# Patient Record
Sex: Male | Born: 1960 | Hispanic: Yes | Marital: Single | Smoking: Former smoker
Health system: Southern US, Community
[De-identification: ages and names within clinical notes are randomized; demographics above are authoritative.]

## PROBLEM LIST (undated history)

## (undated) DIAGNOSIS — B1920 Unspecified viral hepatitis C without hepatic coma: Secondary | ICD-10-CM

## (undated) DIAGNOSIS — I10 Essential (primary) hypertension: Secondary | ICD-10-CM

---

## 2007-09-02 ENCOUNTER — Emergency Department: Payer: Self-pay | Admitting: Emergency Medicine

## 2007-09-08 ENCOUNTER — Emergency Department (HOSPITAL_COMMUNITY): Admission: EM | Admit: 2007-09-08 | Discharge: 2007-09-08 | Payer: Self-pay | Admitting: Emergency Medicine

## 2008-02-14 ENCOUNTER — Emergency Department (HOSPITAL_COMMUNITY): Admission: EM | Admit: 2008-02-14 | Discharge: 2008-02-14 | Payer: Self-pay | Admitting: Emergency Medicine

## 2008-02-19 ENCOUNTER — Ambulatory Visit: Payer: Self-pay | Admitting: *Deleted

## 2008-04-22 ENCOUNTER — Ambulatory Visit: Payer: Self-pay | Admitting: Internal Medicine

## 2008-04-22 ENCOUNTER — Encounter (INDEPENDENT_AMBULATORY_CARE_PROVIDER_SITE_OTHER): Payer: Self-pay | Admitting: Family Medicine

## 2008-04-22 LAB — CONVERTED CEMR LAB
ALT: 79 units/L — ABNORMAL HIGH (ref 0–53)
AST: 55 units/L — ABNORMAL HIGH (ref 0–37)
Albumin: 4.5 g/dL (ref 3.5–5.2)
Calcium: 9 mg/dL (ref 8.4–10.5)
Chloride: 106 meq/L (ref 96–112)
Creatinine, Ser: 0.92 mg/dL (ref 0.40–1.50)
Potassium: 4.2 meq/L (ref 3.5–5.3)
Sodium: 141 meq/L (ref 135–145)

## 2008-05-29 ENCOUNTER — Emergency Department (HOSPITAL_COMMUNITY): Admission: EM | Admit: 2008-05-29 | Discharge: 2008-05-29 | Payer: Self-pay | Admitting: Family Medicine

## 2009-03-05 IMAGING — CR DG LUMBAR SPINE COMPLETE 4+V
5 series · 5 of 5 positions shown · non-contrast
Comparison: No priors

CLINICAL DATA: Back pain following heavy lifting

LUMBAR SPINE - COMPLETE 4+ VIEW

[view not recorded (1 of 5)]
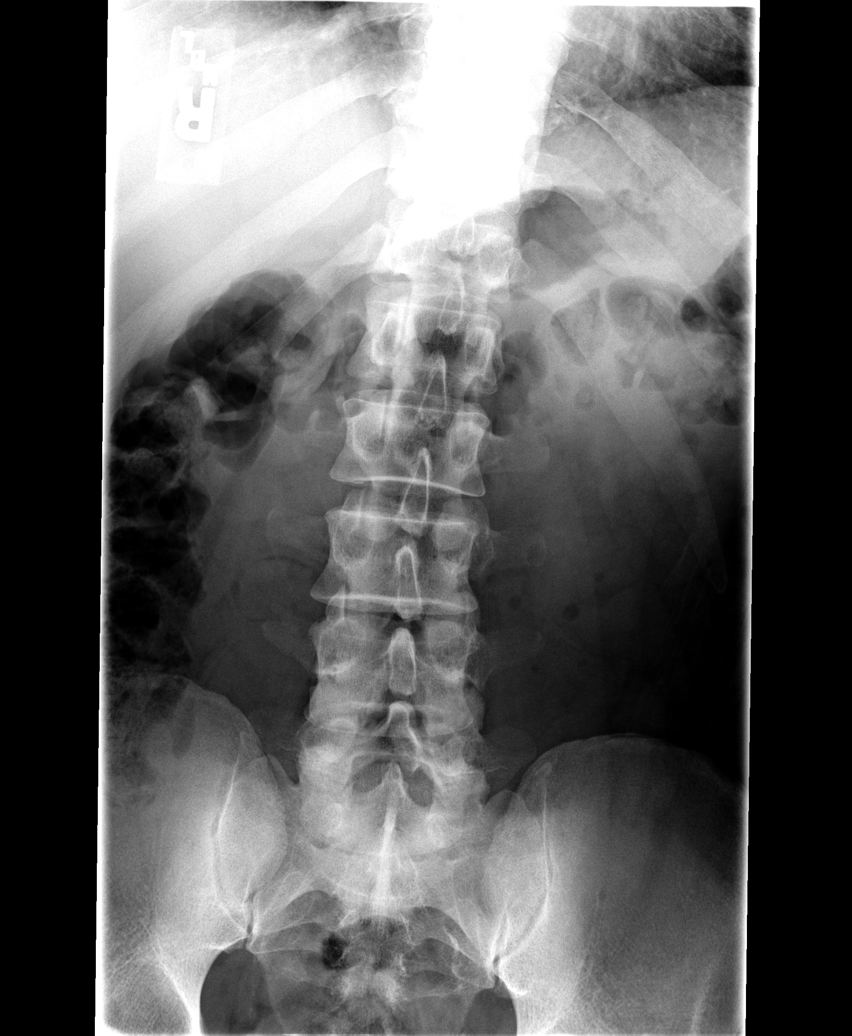

[view not recorded (2 of 5)]
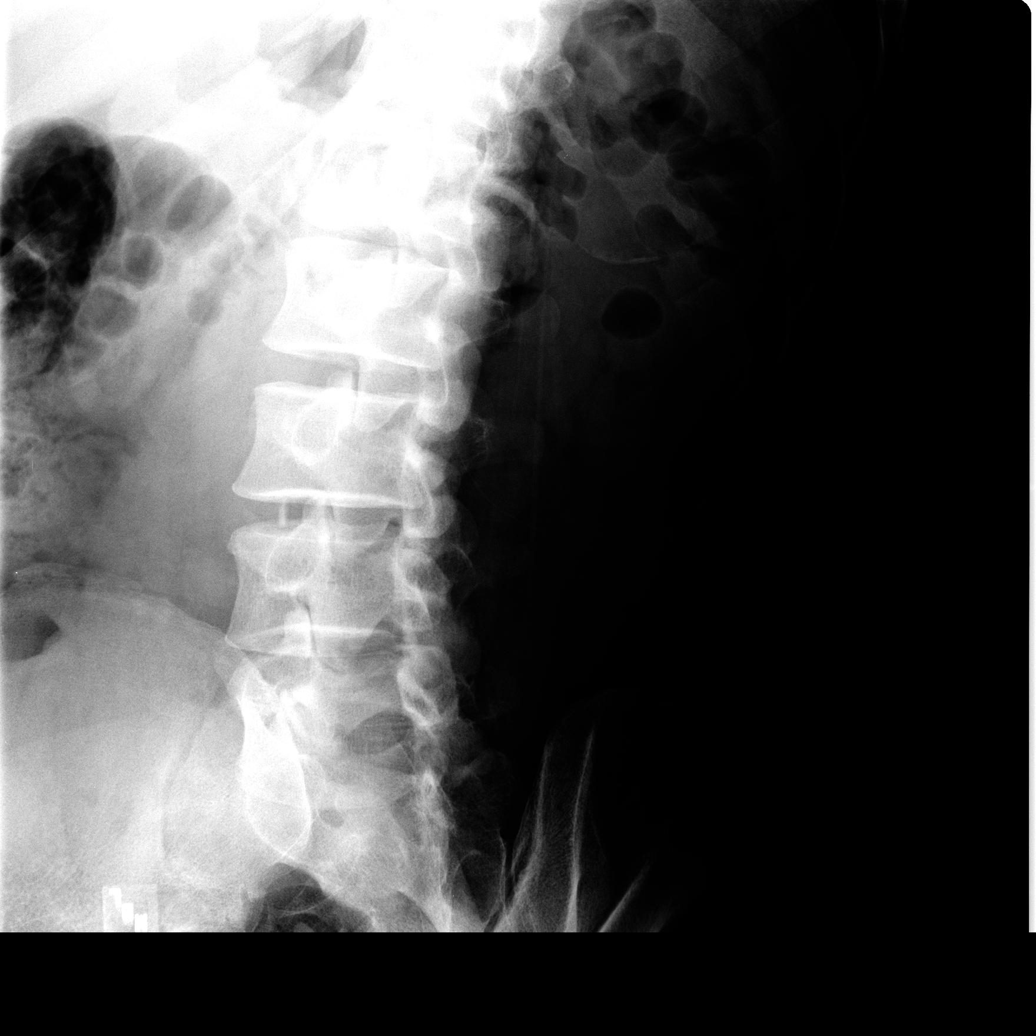

[view not recorded (3 of 5)]
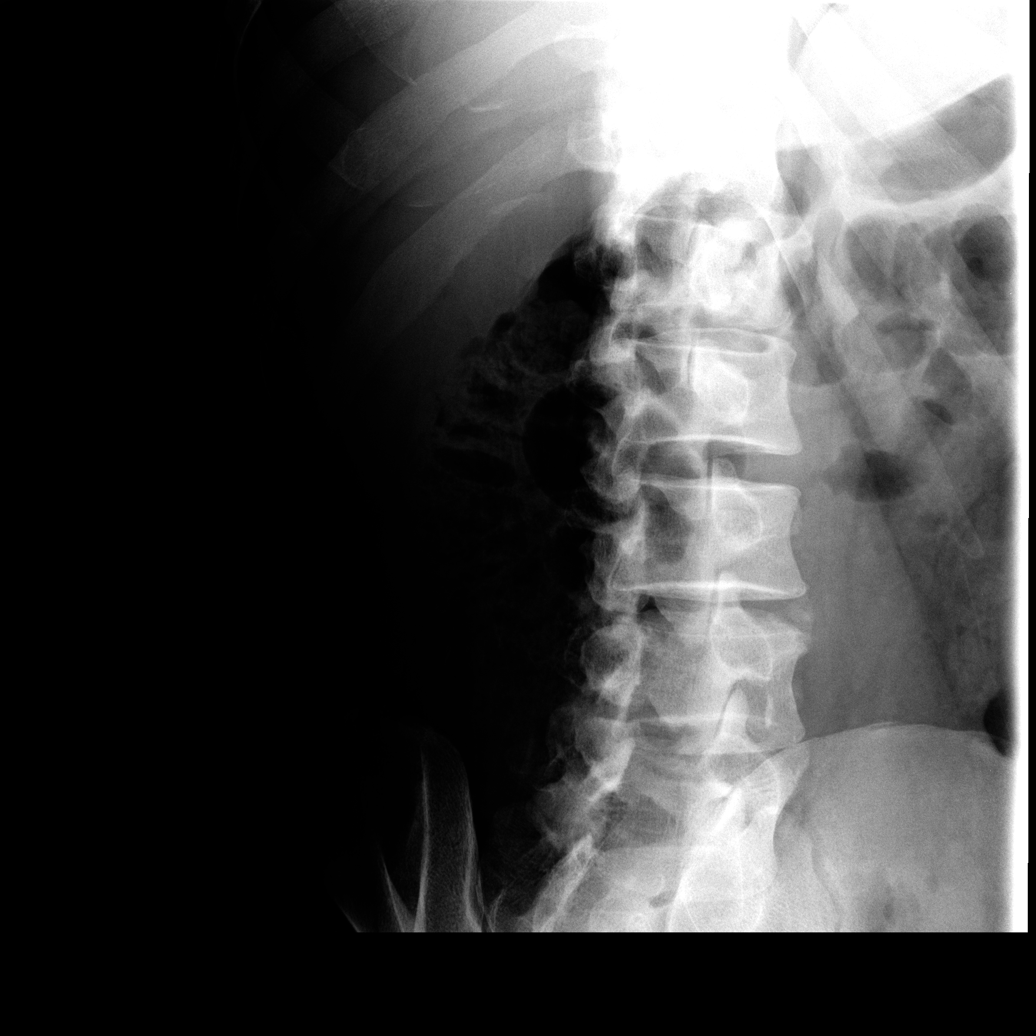

[view not recorded (4 of 5)]
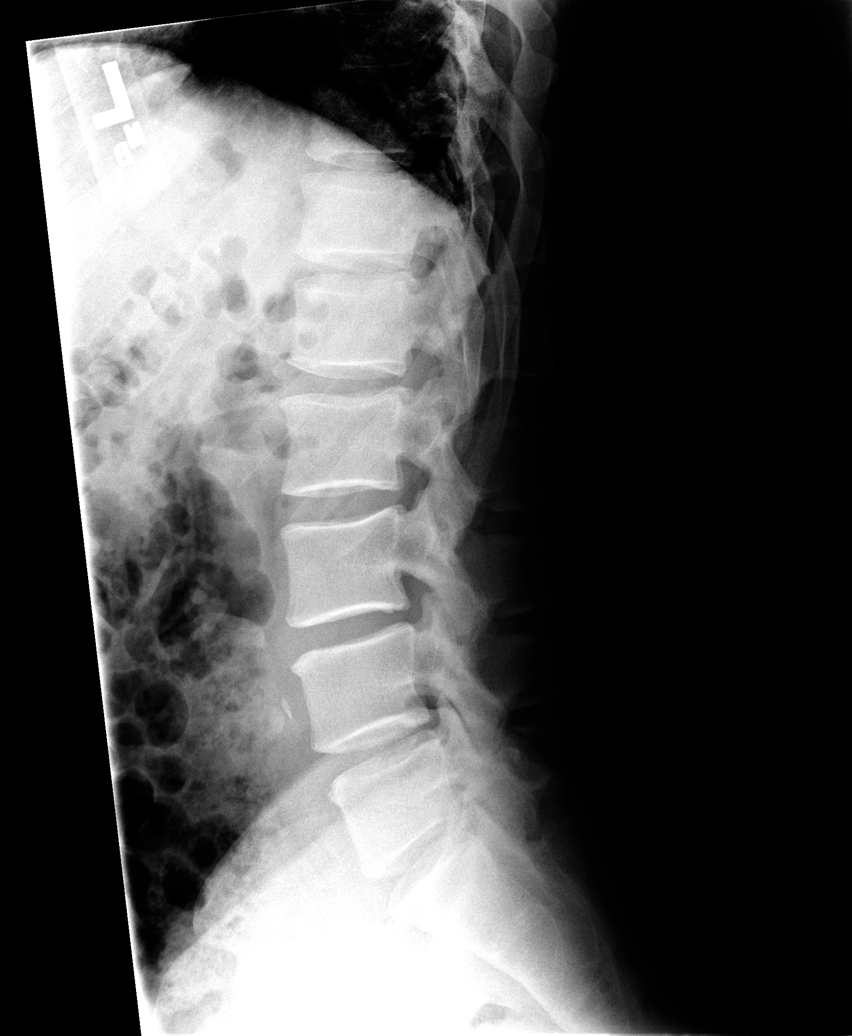

[view not recorded (5 of 5)]
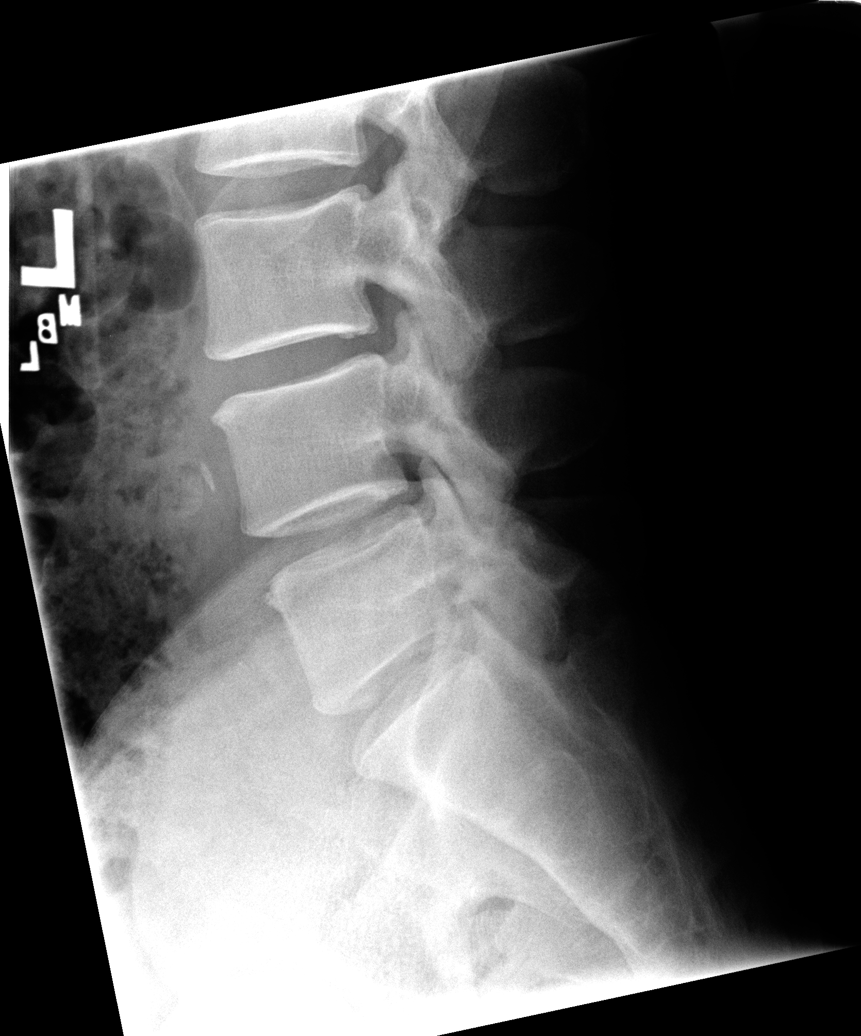

[5 of 5 positions shown; findings below may reference images not displayed]

FINDINGS: If there are five non-rib bearing lumbar type vertebra.
No fracture or subluxation.  There is relative disc narrowing at L3-
L4, and L5-S1.  Minor vertebral body spurring is noted.

Soft tissues unremarkable except for some calcification in the
lower abdominal aorta and iliac arteries.
IMPRESSION: 1.  Degenerative disc disease with no acute findings.
2.  Vascular calcifications are noted.

## 2011-04-16 LAB — COMPREHENSIVE METABOLIC PANEL
AST: 61 — ABNORMAL HIGH
CO2: 32
Calcium: 9.6
Creatinine, Ser: 1.23
GFR calc Af Amer: >60
GFR calc non Af Amer: >60

## 2011-04-16 LAB — DIFFERENTIAL
Lymphocytes Relative: 28
Lymphs Abs: 2.6
Neutro Abs: 5.8
Neutrophils Relative %: 62

## 2011-04-16 LAB — CBC
MCHC: 34.2
MCV: 94
RBC: 5.11

## 2011-10-28 ENCOUNTER — Emergency Department (HOSPITAL_COMMUNITY)
Admission: EM | Admit: 2011-10-28 | Discharge: 2011-10-29 | Disposition: A | Discharge status: Rehabilitation Facility | Payer: Self-pay | Attending: Emergency Medicine | Admitting: Emergency Medicine

## 2011-10-28 ENCOUNTER — Encounter (HOSPITAL_COMMUNITY): Payer: Self-pay

## 2011-10-28 DIAGNOSIS — F1910 Other psychoactive substance abuse, uncomplicated: Secondary | ICD-10-CM | POA: Insufficient documentation

## 2011-10-28 DIAGNOSIS — R112 Nausea with vomiting, unspecified: Secondary | ICD-10-CM | POA: Insufficient documentation

## 2011-10-28 DIAGNOSIS — R109 Unspecified abdominal pain: Secondary | ICD-10-CM | POA: Insufficient documentation

## 2011-10-28 HISTORY — DX: Unspecified viral hepatitis C without hepatic coma: B19.20

## 2011-10-28 LAB — DIFFERENTIAL
Eosinophils Relative: 2 % (ref 0–5)
Lymphocytes Relative: 18 % (ref 12–46)
Lymphs Abs: 1.7 10*3/uL (ref 0.7–4.0)
Monocytes Absolute: 0.5 10*3/uL (ref 0.1–1.0)

## 2011-10-28 LAB — RAPID URINE DRUG SCREEN, HOSP PERFORMED
Barbiturates: NOT DETECTED
Cocaine: POSITIVE — AB
Tetrahydrocannabinol: NOT DETECTED

## 2011-10-28 LAB — COMPREHENSIVE METABOLIC PANEL
ALT: 41 U/L (ref 0–53)
CO2: 26 mEq/L (ref 19–32)
Calcium: 9.3 mg/dL (ref 8.4–10.5)
Chloride: 103 mEq/L (ref 96–112)
Creatinine, Ser: 0.99 mg/dL (ref 0.50–1.35)
GFR calc Af Amer: >90 mL/min (ref >90)
GFR calc non Af Amer: >90 mL/min (ref >90)
Glucose, Bld: 92 mg/dL (ref 70–99)
Total Bilirubin: 0.3 mg/dL (ref 0.3–1.2)

## 2011-10-28 LAB — CBC
HCT: 44 % (ref 39.0–52.0)
Hemoglobin: 15.7 g/dL (ref 13.0–17.0)
MCV: 88.2 fL (ref 78.0–100.0)
RBC: 4.99 MIL/uL (ref 4.22–5.81)
WBC: 9.4 10*3/uL (ref 4.0–10.5)

## 2011-10-28 LAB — URINALYSIS, ROUTINE W REFLEX MICROSCOPIC
Nitrite: NEGATIVE
Protein, ur: NEGATIVE mg/dL
Specific Gravity, Urine: 1.025 (ref 1.005–1.030)
Urobilinogen, UA: 1 mg/dL (ref 0.0–1.0)

## 2011-10-28 MED ORDER — LORAZEPAM 1 MG PO TABS: 0.0000 mg | ORAL_TABLET | Freq: Two times a day (BID) | ORAL | Status: DC

## 2011-10-28 MED ORDER — FOLIC ACID 1 MG PO TABS
1.0000 mg | ORAL_TABLET | Freq: Every day | ORAL | Status: DC
  Administered 2011-10-28 – 2011-10-29 (×2): 1 mg via ORAL
  Filled 2011-10-28 (×2): qty 1

## 2011-10-28 MED ORDER — VITAMIN B-1 100 MG PO TABS
100.0000 mg | ORAL_TABLET | Freq: Every day | ORAL | Status: DC
  Administered 2011-10-28 – 2011-10-29 (×2): 100 mg via ORAL
  Filled 2011-10-28 (×2): qty 1

## 2011-10-28 MED ORDER — ONDANSETRON HCL 8 MG PO TABS
4.0000 mg | ORAL_TABLET | Freq: Three times a day (TID) | ORAL | Status: DC | PRN
  Administered 2011-10-29: 4 mg via ORAL
  Filled 2011-10-28: qty 1
  Filled 2011-10-28: qty 2

## 2011-10-28 MED ORDER — LORAZEPAM 2 MG/ML IJ SOLN: 1.0000 mg | Freq: Four times a day (QID) | INTRAMUSCULAR | Status: DC | PRN

## 2011-10-28 MED ORDER — LORAZEPAM 1 MG PO TABS
0.0000 mg | ORAL_TABLET | Freq: Four times a day (QID) | ORAL | Status: DC
  Administered 2011-10-28: 1 mg via ORAL
  Administered 2011-10-28: 2 mg via ORAL
  Administered 2011-10-29 (×2): 1 mg via ORAL
  Filled 2011-10-28: qty 2
  Filled 2011-10-28 (×2): qty 1

## 2011-10-28 MED ORDER — ACETAMINOPHEN 325 MG PO TABS
650.0000 mg | ORAL_TABLET | ORAL | Status: DC | PRN
  Administered 2011-10-29 (×2): 650 mg via ORAL
  Filled 2011-10-28 (×2): qty 2

## 2011-10-28 MED ORDER — ADULT MULTIVITAMIN W/MINERALS CH
1.0000 | ORAL_TABLET | Freq: Every day | ORAL | Status: DC
  Administered 2011-10-28 – 2011-10-29 (×2): 1 via ORAL
  Filled 2011-10-28 (×2): qty 1

## 2011-10-28 MED ORDER — LORAZEPAM 1 MG PO TABS
1.0000 mg | ORAL_TABLET | Freq: Four times a day (QID) | ORAL | Status: DC | PRN
  Filled 2011-10-28: qty 1

## 2011-10-28 MED ORDER — THIAMINE HCL 100 MG/ML IJ SOLN: 100.0000 mg | Freq: Every day | INTRAMUSCULAR | Status: DC

## 2011-10-28 NOTE — ED Notes (Signed)
Patient here for detox from Heroin.  Pt states that he last used last night.  Pt is having abd cramping at this time.

## 2011-10-28 NOTE — ED Notes (Signed)
Pt presents for detox from heroine, last used last night.  Pt reports over the past week, he has overdosed x 2.  Pt denies any suicidal ideations at this point, reports "I'm trying to get help before that happens.".  Pt denies any seizure activity with past lapses of use but reports having dt's.

## 2011-10-28 NOTE — ED Provider Notes (Signed)
History     CSN: [id number]  Arrival date & time 10/28/11  1236   First MD Initiated Contact with Patient 10/28/11 1258      Chief Complaint  Patient presents with  . Psychiatric Evaluation    (Consider location/radiation/quality/duration/timing/severity/associated sxs/prior treatment) HPI Comments: Patient requests detox for multiple substances including heroin, cocaine, alcohol. His last heroin use was yesterday. Last drink was yesterday. He does have a history of DTs and seizures. He states he overdosed on heroin x2 in the past week. He denies any suicidal ideation or homicidal ideation. Denies any medical problems except for hepatitis C.  The history is provided by the patient.    Past Medical History  Diagnosis Date  . Hepatitis C     History reviewed. No pertinent past surgical history.  No family history on file.  History  Substance Use Topics  . Smoking status: Current Everyday Smoker -- 0.5 packs/day  . Smokeless tobacco: Not on file  . Alcohol Use: Yes      Review of Systems  Constitutional: Positive for activity change and appetite change. Negative for fever.  HENT: Negative for congestion and rhinorrhea.   Respiratory: Negative for cough, chest tightness and shortness of breath.   Cardiovascular: Negative for chest pain.  Gastrointestinal: Positive for nausea, vomiting, abdominal pain and diarrhea.  Genitourinary: Negative for dysuria and hematuria.  Musculoskeletal: Negative for back pain.  Skin: Negative for rash.  Neurological: Negative for dizziness and headaches.    Allergies  Review of patient's allergies indicates no known allergies.  Home Medications   Current Outpatient Rx  Name Route Sig Dispense Refill  . DIPHENHYDRAMINE HCL 25 MG PO TABS Oral Take 25 mg by mouth every 6 (six) hours as needed. itching      BP 118/73  Pulse 63  Temp(Src) 97.3 F (36.3 C) (Oral)  Resp 16  Ht 5\' 10"  (1.778 m)  Wt 185 lb (83.915 kg)  BMI 26.54 kg/m2   SpO2 100%  Physical Exam  Constitutional: He is oriented to person, place, and time. He appears well-developed and well-nourished. No distress.  HENT:  Head: Normocephalic and atraumatic.  Mouth/Throat: Oropharynx is clear and moist. No oropharyngeal exudate.  Eyes: Conjunctivae are normal. Pupils are equal, round, and reactive to light.  Neck: Normal range of motion. Neck supple.  Cardiovascular: Normal rate, regular rhythm and normal heart sounds.   Pulmonary/Chest: Effort normal and breath sounds normal. No respiratory distress.  Abdominal: Soft. There is no tenderness. There is no rebound and no guarding.  Musculoskeletal: Normal range of motion. He exhibits no edema and no tenderness.  Neurological: He is alert and oriented to person, place, and time. No cranial nerve deficit.  Skin: Skin is warm.    ED Course  Procedures (including critical care time)  Labs Reviewed  COMPREHENSIVE METABOLIC PANEL - Abnormal; Notable for the following:    Albumin 3.4 (*)    AST 41 (*)    All other components within normal limits  URINALYSIS, ROUTINE W REFLEX MICROSCOPIC - Abnormal; Notable for the following:    Color, Urine AMBER (*) BIOCHEMICALS MAY BE AFFECTED BY COLOR   Bilirubin Urine SMALL (*)    Ketones, ur 15 (*)    All other components within normal limits  URINE RAPID DRUG SCREEN (HOSP PERFORMED) - Abnormal; Notable for the following:    Opiates POSITIVE (*)    Cocaine POSITIVE (*)    Benzodiazepines POSITIVE (*)    All other components within normal limits  CBC  DIFFERENTIAL  ETHANOL   No results found.   1. Polysubstance abuse       MDM  Polysubstance abuse requesting detox. Mild tremors noted. Vitals stable.  Alcohol withdrawal protocol. Baseline labs, discuss with act team        Glynn Octave, MD 10/29/11 [phone]

## 2011-10-28 NOTE — BH Assessment (Signed)
Assessment Note   William Noble is an 51 y.o. male.   Axis I: Polysubstance Abuse  Axis II: Deferred Axis III:  Past Medical History  Diagnosis Date  . Hepatitis C    Axis IV: economic problems, housing problems, occupational problems, other psychosocial or environmental problems, problems related to legal system/crime and problems with primary support group Axis V: 51-60 moderate symptoms  Past Medical History:  Past Medical History  Diagnosis Date  . Hepatitis C     History reviewed. No pertinent past surgical history.  Family History: No family history on file.  Social History:  reports that he has been smoking.  He does not have any smokeless tobacco history on file. He reports that he drinks alcohol. He reports that he uses illicit drugs (Cocaine).  Additional Social History:    Allergies: No Known Allergies  Home Medications:  Medications Prior to Admission  Medication Dose Route Frequency Provider Last Rate Last Dose  . acetaminophen (TYLENOL) tablet 650 mg  650 mg Oral Q4H PRN Glynn Octave, MD      . folic acid (FOLVITE) tablet 1 mg  1 mg Oral Daily Glynn Octave, MD   1 mg at 10/28/11 1500  . LORazepam (ATIVAN) tablet 1 mg  1 mg Oral Q6H PRN Glynn Octave, MD       Or  . LORazepam (ATIVAN) injection 1 mg  1 mg Intravenous Q6H PRN Glynn Octave, MD      . LORazepam (ATIVAN) tablet 0-4 mg  0-4 mg Oral Q6H Glynn Octave, MD   1 mg at 10/28/11 2011   Followed by  . LORazepam (ATIVAN) tablet 0-4 mg  0-4 mg Oral Q12H Glynn Octave, MD      . mulitivitamin with minerals tablet 1 tablet  1 tablet Oral Daily Glynn Octave, MD   1 tablet at 10/28/11 1500  . ondansetron (ZOFRAN) tablet 4 mg  4 mg Oral Q8H PRN Glynn Octave, MD      . thiamine (VITAMIN B-1) tablet 100 mg  100 mg Oral Daily Glynn Octave, MD   100 mg at 10/28/11 1500   Or  . thiamine (B-1) injection 100 mg  100 mg Intravenous Daily Glynn Octave, MD       Medications Prior to  Admission  Medication Sig Dispense Refill  . diphenhydrAMINE (BENADRYL) 25 MG tablet Take 25 mg by mouth every 6 (six) hours as needed. itching        OB/GYN Status:  No LMP for male patient.  General Assessment Data Location of Assessment: [facility] ED ACT Assessment: Yes Living Arrangements: Other (Comment) Admission Status: Voluntary Is patient capable of signing voluntary admission?: Yes Transfer from: Other (Comment) Referral Source: Self/Family/Friend  Education Status Is patient currently in school?: No  Risk to self Suicidal Ideation: No Suicidal Intent: No Is patient at risk for suicide?: No Suicidal Plan?: No Access to Means: No What has been your use of drugs/alcohol within the last 12 months?:  (Used last night ) Previous Attempts/Gestures: No How many times?:  (none reported ) Other Self Harm Risks:  (none reported ) Triggers for Past Attempts: None known Intentional Self Injurious Behavior:  (none reported ) Family Suicide History: No Recent stressful life event(s): Conflict (Comment) Persecutory voices/beliefs?: No Depression: Yes Depression Symptoms: Despondent;Tearfulness;Fatigue;Guilt;Loss of interest in usual pleasures;Feeling worthless/self pity Substance abuse history and/or treatment for substance abuse?: No Suicide prevention information given to non-admitted patients: Not applicable  Risk to Others Homicidal Ideation: No Thoughts of Harm to Others:  No Current Homicidal Intent: No Current Homicidal Plan: No Access to Homicidal Means: No Identified Victim:  (na) History of harm to others?: No Assessment of Violence: None Noted Violent Behavior Description:  (none ) Does patient have access to weapons?: No Criminal Charges Pending?: Yes Describe Pending Criminal Charges: forging scripts to [facility]. Does patient have a court date: Yes Court Date: 12/23/11  Psychosis Hallucinations: None noted Delusions: None noted  Mental Status  Report Appear/Hygiene: Disheveled Eye Contact: Fair Motor Activity: Agitation;Unsteady Speech: Logical/coherent Level of Consciousness: Alert;Quiet/awake Mood: Depressed;Anxious;Despair;Empty Affect: Anxious Anxiety Level: Minimal Thought Processes: Coherent Judgement: Impaired Orientation: Person;Place;Time;Situation Obsessive Compulsive Thoughts/Behaviors: None  Cognitive Functioning Concentration: Decreased Memory: Recent Intact IQ: Average Insight: Fair Impulse Control: Poor Appetite: Poor Weight Loss:  (none reported ) Weight Gain:  (na) Sleep: Decreased Total Hours of Sleep:  (cannot remember ) Vegetative Symptoms: None  Prior Inpatient Therapy Prior Inpatient Therapy: No Prior Therapy Dates:  (none reported ) Prior Therapy Facilty/Provider(s):  (none reported ) Reason for Treatment:  (none reported )  Prior Outpatient Therapy Prior Outpatient Therapy: No Prior Therapy Dates:  (none reported ) Prior Therapy Facilty/Provider(s):  (none ) Reason for Treatment:  (Detox)                     Additional Information 1:1 In Past 12 Months?: No CIRT Risk: No Elopement Risk: No Does patient have medical clearance?: Yes     Disposition: Pending placement at [facility]. Disposition Disposition of Patient: Other dispositions Other disposition(s): Other (Comment)  On Site Evaluation by:   Reviewed with Physician:     Phillip Heal LaVerne 10/28/2011 11:27 PM

## 2011-10-28 NOTE — BH Assessment (Signed)
Assessment Note   William Noble is an 51 y.o. male that is requesting Detox services.  William Noble endorses withdrawal symptoms that include chills, diarrhea, irritability and weakness.  William Noble denies any psychosis.  William Noble denies any previous substance abuse treatment.  William Noble reports being in prison for 10 years as his means of no longer using.  William Noble reports being addicted to Heroin.  William Noble reports that he drank a case of beer, heroin 10-15 bags, and an 8ball of cocaine last night.  William Noble reports using on a daily basis.   Axis I: 304.80 Polysubstance Dependence  Axis II: Deferred Axis III:  Past Medical History  Diagnosis Date  . Hepatitis C    Axis IV: economic problems, housing problems, occupational problems, other psychosocial or environmental problems, problems related to legal system/crime, problems with access to health care services and problems with primary support group Axis V: 41-50 serious symptoms  Past Medical History:  Past Medical History  Diagnosis Date  . Hepatitis C     History reviewed. No pertinent past surgical history.  Family History: No family history on file.  Social History:  reports that he has been smoking.  He does not have any smokeless tobacco history on file. He reports that he drinks alcohol. He reports that he uses illicit drugs (Cocaine).  Additional Social History:    Allergies: No Known Allergies  Home Medications:  Medications Prior to Admission  Medication Dose Route Frequency Provider Last Rate Last Dose  . acetaminophen (TYLENOL) tablet 650 mg  650 mg Oral Q4H PRN Glynn Octave, MD      . folic acid (FOLVITE) tablet 1 mg  1 mg Oral Daily Glynn Octave, MD   1 mg at 10/28/11 1500  . LORazepam (ATIVAN) tablet 1 mg  1 mg Oral Q6H PRN Glynn Octave, MD       Or  . LORazepam (ATIVAN) injection 1 mg  1 mg Intravenous Q6H PRN Glynn Octave, MD      . LORazepam (ATIVAN) tablet 0-4 mg  0-4 mg Oral Q6H Glynn Octave, MD   1 mg at  10/28/11 2011   Followed by  . LORazepam (ATIVAN) tablet 0-4 mg  0-4 mg Oral Q12H Glynn Octave, MD      . mulitivitamin with minerals tablet 1 tablet  1 tablet Oral Daily Glynn Octave, MD   1 tablet at 10/28/11 1500  . ondansetron (ZOFRAN) tablet 4 mg  4 mg Oral Q8H PRN Glynn Octave, MD      . thiamine (VITAMIN B-1) tablet 100 mg  100 mg Oral Daily Glynn Octave, MD   100 mg at 10/28/11 1500   Or  . thiamine (B-1) injection 100 mg  100 mg Intravenous Daily Glynn Octave, MD       Medications Prior to Admission  Medication Sig Dispense Refill  . diphenhydrAMINE (BENADRYL) 25 MG tablet Take 25 mg by mouth every 6 (six) hours as needed. itching        OB/GYN Status:  No LMP for male patient.  General Assessment Data Location of Assessment: [facility] ED ACT Assessment: Yes Living Arrangements: Other (Comment) Admission Status: Voluntary Is patient capable of signing voluntary admission?: Yes Transfer from: Other (Comment) Referral Source: Self/Family/Friend  Education Status Is patient currently in school?: No  Risk to self Suicidal Ideation: No Suicidal Intent: No Is patient at risk for suicide?: No Suicidal Plan?: No Access to Means: No What has been your use of drugs/alcohol within the last 12 months?:  (Used last  night ) Previous Attempts/Gestures: No How many times?:  (none reported ) Other Self Harm Risks:  (none reported ) Triggers for Past Attempts: None known Intentional Self Injurious Behavior:  (none reported ) Family Suicide History: No Recent stressful life event(s): Conflict (Comment) Persecutory voices/beliefs?: No Depression: Yes Depression Symptoms: Despondent;Tearfulness;Fatigue;Guilt;Loss of interest in usual pleasures;Feeling worthless/self pity Substance abuse history and/or treatment for substance abuse?: No Suicide prevention information given to non-admitted patients: Not applicable  Risk to Others Homicidal Ideation: No Thoughts of Harm to  Others: No Current Homicidal Intent: No Current Homicidal Plan: No Access to Homicidal Means: No Identified Victim:  (na) History of harm to others?: No Assessment of Violence: None Noted Violent Behavior Description:  (none ) Does patient have access to weapons?: No Criminal Charges Pending?: Yes Describe Pending Criminal Charges: forging scripts to [facility]. Does patient have a court date: Yes Court Date: 12/23/11  Psychosis Hallucinations: None noted Delusions: None noted  Mental Status Report Appear/Hygiene: Disheveled Eye Contact: Fair Motor Activity: Agitation;Unsteady Speech: Logical/coherent Level of Consciousness: Alert;Quiet/awake Mood: Depressed;Anxious;Despair;Empty Affect: Anxious Anxiety Level: Minimal Thought Processes: Coherent Judgement: Impaired Orientation: Person;Place;Time;Situation Obsessive Compulsive Thoughts/Behaviors: None  Cognitive Functioning Concentration: Decreased Memory: Recent Intact IQ: Average Insight: Fair Impulse Control: Poor Appetite: Poor Weight Loss:  (none reported ) Weight Gain:  (na) Sleep: Decreased Total Hours of Sleep:  (cannot remember ) Vegetative Symptoms: None  Prior Inpatient Therapy Prior Inpatient Therapy: No Prior Therapy Dates:  (none reported ) Prior Therapy Facilty/Provider(s):  (none reported ) Reason for Treatment:  (none reported )  Prior Outpatient Therapy Prior Outpatient Therapy: No Prior Therapy Dates:  (none reported ) Prior Therapy Facilty/Provider(s):  (none ) Reason for Treatment:  (Detox)                     Additional Information 1:1 In Past 12 Months?: No CIRT Risk: No Elopement Risk: No Does patient have medical clearance?: Yes     Disposition:  Disposition Disposition of Patient: Other dispositions Other disposition(s): Other (Comment)  On Site Evaluation by:   Reviewed with Physician:     Phillip Heal LaVerne 10/28/2011 11:08 PM

## 2011-10-28 NOTE — ED Notes (Signed)
Dinner trays delivered

## 2011-10-28 NOTE — ED Notes (Signed)
Dinner tray ordered, regular nonsgarp

## 2011-10-28 NOTE — BH Assessment (Signed)
Assessment Note   William Noble is an 51 y.o. male that used 10-15 bags of heroin, an 8-ball, xanax 2-3 pills and a case of beer.  William Noble endorsed withdrawal symptoms of chills, irritability and diarrhea.  William Noble reports that he uses daily and wants to be able to stop abusing drugs.  William Noble denies any substance abuse treatment.  William Noble reports he was able to stop using while in prison for ten years.  William Noble reports two incidents of overdosing.  William Noble was not able to remember the year in which he relapsed.  William Noble reports feelings of depression and hopelessness associated with relapsing in 2008 shortly after he was released from prison. William Noble denies any psychosis.  William Noble denies any SI/HI.    Axis I: Polysubstance Dependence  Axis II: Deferred Axis III:  Past Medical History  Diagnosis Date  . Hepatitis C    Axis IV: economic problems, housing problems, occupational problems, other psychosocial or environmental problems, problems related to legal system/crime, problems related to social environment and problems with primary support group Axis V: 51-60 moderate symptoms  Past Medical History:  Past Medical History  Diagnosis Date  . Hepatitis C     History reviewed. No pertinent past surgical history.  Family History: No family history on file.  Social History:  reports that he has been smoking.  He does not have any smokeless tobacco history on file. He reports that he drinks alcohol. He reports that he uses illicit drugs (Cocaine).  Additional Social History:    Allergies: No Known Allergies  Home Medications:  Medications Prior to Admission  Medication Dose Route Frequency Provider Last Rate Last Dose  . acetaminophen (TYLENOL) tablet 650 mg  650 mg Oral Q4H PRN William Octave, MD      . folic acid (FOLVITE) tablet 1 mg  1 mg Oral Daily William Octave, MD   1 mg at 10/28/11 1500  . LORazepam (ATIVAN) tablet 1 mg  1 mg Oral Q6H PRN William Octave, MD       Or  . LORazepam  (ATIVAN) injection 1 mg  1 mg Intravenous Q6H PRN William Octave, MD      . LORazepam (ATIVAN) tablet 0-4 mg  0-4 mg Oral Q6H William Octave, MD   1 mg at 10/28/11 2011   Followed by  . LORazepam (ATIVAN) tablet 0-4 mg  0-4 mg Oral Q12H William Octave, MD      . mulitivitamin with minerals tablet 1 tablet  1 tablet Oral Daily William Octave, MD   1 tablet at 10/28/11 1500  . ondansetron (ZOFRAN) tablet 4 mg  4 mg Oral Q8H PRN William Octave, MD      . thiamine (VITAMIN B-1) tablet 100 mg  100 mg Oral Daily William Octave, MD   100 mg at 10/28/11 1500   Or  . thiamine (B-1) injection 100 mg  100 mg Intravenous Daily William Octave, MD       Medications Prior to Admission  Medication Sig Dispense Refill  . diphenhydrAMINE (BENADRYL) 25 MG tablet Take 25 mg by mouth every 6 (six) hours as needed. itching        OB/GYN Status:  No LMP for male patient.  General Assessment Data Location of Assessment: [facility] ED ACT Assessment: Yes Living Arrangements: Other (Comment) Admission Status: Voluntary Is patient capable of signing voluntary admission?: Yes Transfer from: Other (Comment) Referral Source: Self/Family/Friend  Education Status Is patient currently in school?: No  Risk to self Suicidal Ideation: No Suicidal Intent:  No Is patient at risk for suicide?: No Suicidal Plan?: No Access to Means: No What has been your use of drugs/alcohol within the last 12 months?:  (Used last night ) Previous Attempts/Gestures: No How many times?:  (none reported ) Other Self Harm Risks:  (none reported ) Triggers for Past Attempts: None known Intentional Self Injurious Behavior:  (none reported ) Family Suicide History: No Recent stressful life event(s): Conflict (Comment) Persecutory voices/beliefs?: No Depression: Yes Depression Symptoms: Despondent;Tearfulness;Fatigue;Guilt;Loss of interest in usual pleasures;Feeling worthless/self pity Substance abuse history and/or treatment for  substance abuse?: No Suicide prevention information given to non-admitted patients: Not applicable  Risk to Others Homicidal Ideation: No Thoughts of Harm to Others: No Current Homicidal Intent: No Current Homicidal Plan: No Access to Homicidal Means: No Identified Victim:  (na) History of harm to others?: No Assessment of Violence: None Noted Violent Behavior Description:  (none ) Does patient have access to weapons?: No Criminal Charges Pending?: Yes Describe Pending Criminal Charges: forging scripts to [facility]. Does patient have a court date: Yes Court Date: 12/23/11  Psychosis Hallucinations: None noted Delusions: None noted  Mental Status Report Appear/Hygiene: Disheveled Eye Contact: Fair Motor Activity: Agitation;Unsteady Speech: Logical/coherent Level of Consciousness: Alert;Quiet/awake Mood: Depressed;Anxious;Despair;Empty Affect: Anxious Anxiety Level: Minimal Thought Processes: Coherent Judgement: Impaired Orientation: Person;Place;Time;Situation Obsessive Compulsive Thoughts/Behaviors: None  Cognitive Functioning Concentration: Decreased Memory: Recent Intact IQ: Average Insight: Fair Impulse Control: Poor Appetite: Poor Weight Loss:  (none reported ) Weight Gain:  (na) Sleep: Decreased Total Hours of Sleep:  (cannot remember ) Vegetative Symptoms: None  Prior Inpatient Therapy Prior Inpatient Therapy: No Prior Therapy Dates:  (none reported ) Prior Therapy Facilty/Provider(s):  (none reported ) Reason for Treatment:  (none reported )  Prior Outpatient Therapy Prior Outpatient Therapy: No Prior Therapy Dates:  (none reported ) Prior Therapy Facilty/Provider(s):  (none ) Reason for Treatment:  (Detox)                     Additional Information 1:1 In Past 12 Months?: No CIRT Risk: No Elopement Risk: No Does patient have medical clearance?: Yes     Disposition: Pending disposition at [facility]  Disposition Disposition of  Patient: Other dispositions Other disposition(s): Other (Comment)  On Site Evaluation by:   Reviewed with Physician:     William Noble 10/28/2011 11:31 PM

## 2011-10-29 NOTE — ED Notes (Signed)
Patient sleeping with NAD at this time. 

## 2011-10-29 NOTE — ED Notes (Signed)
Pt resting quietly with eyes closed, respirations equal and non-labored.  Pt is sleeping in reclining chair.

## 2011-10-29 NOTE — ED Notes (Signed)
Pt moved from room #28 to room #27 for actual bed.  Pt report given and care endorsed to [place], [place].

## 2011-10-29 NOTE — BH Assessment (Signed)
Assessment Note   William Noble is an 51 y.o. male that used 10-15 bags of heroin, an 8-ball, xanax 2-3 pills and a case of beer.  William Noble endorsed withdrawal symptoms of chills, irritability and diarrhea.  William Noble reports that he uses daily and wants to be able to stop abusing drugs.  William Noble denies any substance abuse treatment.  William Noble reports he was able to stop using while in prison for ten years.  William Noble reports two incidents of overdosing.  William Noble was not able to remember the year in which he relapsed.  William Noble reports feelings of depression and hopelessness associated with relapsing in 2008 shortly after he was released from prison. William Noble denies any psychosis.  William Noble denies any SI/HI.  William Noble was referred to [facility] for detox.   Axis I: Polysubstance Dependence  Axis II: Deferred Axis III:  Past Medical History  Diagnosis Date  . Hepatitis C    Axis IV: economic problems, housing problems, occupational problems, other psychosocial or environmental problems, problems related to legal system/crime, problems related to social environment and problems with primary support group Axis V: 51-60 moderate symptoms  Past Medical History:  Past Medical History  Diagnosis Date  . Hepatitis C     History reviewed. No pertinent past surgical history.  Family History: No family history on file.  Social History:  reports that he has been smoking.  He does not have any smokeless tobacco history on file. He reports that he drinks alcohol. He reports that he uses illicit drugs (Cocaine).  Additional Social History:    Allergies: No Known Allergies  Home Medications:  Medications Prior to Admission  Medication Dose Route Frequency Provider Last Rate Last Dose  . acetaminophen (TYLENOL) tablet 650 mg  650 mg Oral Q4H PRN William Octave, MD      . folic acid (FOLVITE) tablet 1 mg  1 mg Oral Daily William Octave, MD   1 mg at 10/28/11 1500  . LORazepam (ATIVAN) tablet 1 mg  1 mg Oral Q6H PRN  William Octave, MD       Or  . LORazepam (ATIVAN) injection 1 mg  1 mg Intravenous Q6H PRN William Octave, MD      . LORazepam (ATIVAN) tablet 0-4 mg  0-4 mg Oral Q6H William Octave, MD   1 mg at 10/29/11 0035   Followed by  . LORazepam (ATIVAN) tablet 0-4 mg  0-4 mg Oral Q12H William Octave, MD      . mulitivitamin with minerals tablet 1 tablet  1 tablet Oral Daily William Octave, MD   1 tablet at 10/28/11 1500  . ondansetron (ZOFRAN) tablet 4 mg  4 mg Oral Q8H PRN William Octave, MD      . thiamine (VITAMIN B-1) tablet 100 mg  100 mg Oral Daily William Octave, MD   100 mg at 10/28/11 1500   Or  . thiamine (B-1) injection 100 mg  100 mg Intravenous Daily William Octave, MD       No current outpatient prescriptions on file as of 10/28/2011.    OB/GYN Status:  No LMP for male patient.  General Assessment Data Location of Assessment: [facility] ED ACT Assessment: Yes Living Arrangements: Other (Comment) Admission Status: Voluntary Is patient capable of signing voluntary admission?: Yes Transfer from: Other (Comment) Referral Source: Self/Family/Friend  Education Status Is patient currently in school?: No  Risk to self Suicidal Ideation: No Suicidal Intent: No Is patient at risk for suicide?: No Suicidal Plan?: No Access to Means: No What  has been your use of drugs/alcohol within the last 12 months?:  (Used last night ) Previous Attempts/Gestures: No How many times?:  (none reported ) Other Self Harm Risks:  (none reported ) Triggers for Past Attempts: None known Intentional Self Injurious Behavior:  (none reported ) Family Suicide History: No Recent stressful life event(s): Conflict (Comment) Persecutory voices/beliefs?: No Depression: Yes Depression Symptoms: Despondent;Tearfulness;Fatigue;Guilt;Loss of interest in usual pleasures;Feeling worthless/self pity Substance abuse history and/or treatment for substance abuse?: No Suicide prevention information given to  non-admitted patients: Not applicable  Risk to Others Homicidal Ideation: No Thoughts of Harm to Others: No Current Homicidal Intent: No Current Homicidal Plan: No Access to Homicidal Means: No Identified Victim:  (na) History of harm to others?: No Assessment of Violence: None Noted Violent Behavior Description:  (none ) Does patient have access to weapons?: No Criminal Charges Pending?: Yes Describe Pending Criminal Charges: forging scripts to [facility]. Does patient have a court date: Yes Court Date: 12/23/11  Psychosis Hallucinations: None noted Delusions: None noted  Mental Status Report Appear/Hygiene: Disheveled Eye Contact: Fair Motor Activity: Agitation;Unsteady Speech: Logical/coherent Level of Consciousness: Alert;Quiet/awake Mood: Depressed;Anxious;Despair;Empty Affect: Anxious Anxiety Level: Minimal Thought Processes: Coherent Judgement: Impaired Orientation: Person;Place;Time;Situation Obsessive Compulsive Thoughts/Behaviors: None  Cognitive Functioning Concentration: Decreased Memory: Recent Intact IQ: Average Insight: Fair Impulse Control: Poor Appetite: Poor Weight Loss:  (none reported ) Weight Gain:  (na) Sleep: Decreased Total Hours of Sleep:  (cannot remember ) Vegetative Symptoms: None  Prior Inpatient Therapy Prior Inpatient Therapy: No Prior Therapy Dates:  (none reported ) Prior Therapy Facilty/Provider(s):  (none reported ) Reason for Treatment:  (none reported )  Prior Outpatient Therapy Prior Outpatient Therapy: No Prior Therapy Dates:  (none reported ) Prior Therapy Facilty/Provider(s):  (none ) Reason for Treatment:  (Detox)                     Additional Information 1:1 In Past 12 Months?: No CIRT Risk: No Elopement Risk: No Does patient have medical clearance?: Yes     Disposition: Pending disposition at [facility].   Disposition Disposition of Patient: Other dispositions Other disposition(s): Other  (Comment)  On Site Evaluation by:   Reviewed with Physician:     William Noble 10/29/2011 12:47 AM

## 2011-10-29 NOTE — ED Notes (Signed)
Patient had clothing and belongings with him as he left [facility].

## 2011-10-29 NOTE — ED Notes (Signed)
Patient resting with NAD at this time. Patient advised he is going to [facility] this morning approx. 1030.

## 2011-12-23 ENCOUNTER — Emergency Department: Payer: Self-pay | Admitting: Emergency Medicine

## 2011-12-23 LAB — COMPREHENSIVE METABOLIC PANEL
Albumin: 3.6 g/dL (ref 3.4–5.0)
Bilirubin,Total: 0.3 mg/dL (ref 0.2–1.0)
Creatinine: 1 mg/dL (ref 0.60–1.30)
EGFR (Non-African Amer.): >60
Glucose: 136 mg/dL — ABNORMAL HIGH (ref 65–99)
Osmolality: 279 (ref 275–301)
Sodium: 139 mmol/L (ref 136–145)

## 2011-12-23 LAB — ETHANOL
Ethanol %: <0.003 % (ref 0.000–0.080)
Ethanol: <3 mg/dL

## 2011-12-23 LAB — TSH: Thyroid Stimulating Horm: 1.06 u[IU]/mL

## 2011-12-23 LAB — ACETAMINOPHEN LEVEL: Acetaminophen: <2 ug/mL

## 2011-12-23 LAB — DRUG SCREEN, URINE: MDMA (Ecstasy)Ur Screen: NEGATIVE (ref ?–500)

## 2011-12-23 LAB — CBC
MCH: 31.2 pg (ref 26.0–34.0)
Platelet: 171 10*3/uL (ref 150–440)

## 2012-02-07 ENCOUNTER — Emergency Department (HOSPITAL_COMMUNITY)
Admission: EM | Admit: 2012-02-07 | Discharge: 2012-02-08 | Payer: Self-pay | Attending: Emergency Medicine | Admitting: Emergency Medicine

## 2012-02-07 ENCOUNTER — Encounter (HOSPITAL_COMMUNITY): Payer: Self-pay

## 2012-02-07 DIAGNOSIS — F1110 Opioid abuse, uncomplicated: Secondary | ICD-10-CM

## 2012-02-07 DIAGNOSIS — IMO0002 Reserved for concepts with insufficient information to code with codable children: Secondary | ICD-10-CM | POA: Insufficient documentation

## 2012-02-07 DIAGNOSIS — F17200 Nicotine dependence, unspecified, uncomplicated: Secondary | ICD-10-CM | POA: Insufficient documentation

## 2012-02-07 DIAGNOSIS — R52 Pain, unspecified: Secondary | ICD-10-CM | POA: Insufficient documentation

## 2012-02-07 DIAGNOSIS — F1910 Other psychoactive substance abuse, uncomplicated: Secondary | ICD-10-CM | POA: Insufficient documentation

## 2012-02-07 DIAGNOSIS — Z23 Encounter for immunization: Secondary | ICD-10-CM | POA: Insufficient documentation

## 2012-02-07 DIAGNOSIS — T148XXA Other injury of unspecified body region, initial encounter: Secondary | ICD-10-CM

## 2012-02-07 LAB — GLUCOSE, CAPILLARY: Glucose-Capillary: 72 mg/dL (ref 70–99)

## 2012-02-07 MED ORDER — TETANUS-DIPHTH-ACELL PERTUSSIS 5-2.5-18.5 LF-MCG/0.5 IM SUSP
0.5000 mL | Freq: Once | INTRAMUSCULAR | Status: AC
  Administered 2012-02-07: 0.5 mL via INTRAMUSCULAR
  Filled 2012-02-07: qty 0.5

## 2012-02-07 NOTE — ED Notes (Signed)
Per EMS they removed 4 tazer prong from pts upper lt back, pt is in police custody, pt states did "alot" of herion 2hrs ago. Pt a/o x4 but is lethargic.

## 2012-02-07 NOTE — ED Notes (Signed)
Meal given

## 2012-02-07 NOTE — ED Notes (Signed)
[id number]<AV> Expected date:02/07/12<BR> Expected time: 1:49 PM<BR> Means of arrival:Ambulance<BR> Comments:<BR> Heroin use/tazed by [employer]

## 2012-02-07 NOTE — ED Provider Notes (Signed)
History     CSN: [id number]  Arrival date & time 02/07/12  1421   First MD Initiated Contact with Patient 02/07/12 1524      Chief Complaint  Patient presents with  . pain all over     (Consider location/radiation/quality/duration/timing/severity/associated sxs/prior treatment) HPI Comments: William Noble is a 51 y.o. Male who was being arrested, required tazers to be restrained. He fell forward, injuring his arms, and knees. He did not hit his head or lose consciousness. He presents here for evaluation of lethargy. He reports that he did a large amount of heroin about 2 hours ago. He denies headache, weakness, dizziness, nausea, vomiting. Patient is under arrest. EMS, removed the tazer prongs without incident.   The history is provided by the patient.    Past Medical History  Diagnosis Date  . Hepatitis C     History reviewed. No pertinent past surgical history.  No family history on file.  History  Substance Use Topics  . Smoking status: Current Everyday Smoker -- 0.5 packs/day  . Smokeless tobacco: Not on file  . Alcohol Use: Yes      Review of Systems  All other systems reviewed and are negative.    Allergies  Review of patient's allergies indicates no known allergies.  Home Medications  No current outpatient prescriptions on file.  BP 130/78  Pulse 70  Temp 98.6 F (37 C) (Oral)  Resp 20  SpO2 96%  Physical Exam  Nursing note and vitals reviewed. Constitutional: He is oriented to person, place, and time. He appears well-developed and well-nourished. No distress.       Sleepy but alert and responsive to questions. He answers appropriately.  HENT:  Head: Normocephalic and atraumatic.  Right Ear: External ear normal.  Left Ear: External ear normal.  Eyes: Conjunctivae and EOM are normal. Pupils are equal, round, and reactive to light.  Neck: Normal range of motion and phonation normal. Neck supple.  Cardiovascular: Normal rate, regular rhythm,  normal heart sounds and intact distal pulses.   Pulmonary/Chest: Effort normal and breath sounds normal. He exhibits no bony tenderness.  Abdominal: Soft. Normal appearance and bowel sounds are normal. He exhibits no mass. There is no tenderness. There is no rebound and no guarding.  Musculoskeletal: Normal range of motion.  Neurological: He is alert and oriented to person, place, and time. He has normal strength. No cranial nerve deficit or sensory deficit. He exhibits normal muscle tone. Coordination normal.  Skin: Skin is warm, dry and intact.       Scattered abrasions, both forearms, and then both knees; no swelling, or deformities. No active bleeding. Left flank remarkable for 4 areas that appear to have been sites of tazer prong insertion.  Psychiatric: He has a normal mood and affect. His behavior is normal.    ED Course  Procedures (including critical care time)  CBG- 72. Repeat after eating- 109  Tetanus booster given  Wound care per nursing, staff   Labs Reviewed  GLUCOSE, CAPILLARY - Abnormal; Notable for the following:    Glucose-Capillary 109 (*)     All other components within normal limits  GLUCOSE, CAPILLARY   No results found.   1. Abrasion   2. Narcotic abuse       MDM  Impression second to fall after tazer, incapacitation. No serious injury. Lethargy due to heroin use. It is suspected this will gradually improve. He is not in respiratory distress. Pulse oxygenation is 97% on room air. Borderline low  CBG was treated successfully with eating. Doubt it will decrease again.    Plan: Home Medications- Tylenol prn; Home Treatments- Wound care with cleansings; Recommended follow up- PCP of choice prn    D/C with [employer] officer in custody     Flint Melter, MD 02/07/12 [phone]

## 2012-02-07 NOTE — ED Notes (Signed)
[employer] at bedside 

## 2019-11-13 ENCOUNTER — Ambulatory Visit: Payer: Self-pay | Attending: Family

## 2019-11-13 DIAGNOSIS — Z23 Encounter for immunization: Secondary | ICD-10-CM

## 2019-11-13 NOTE — Progress Notes (Signed)
   Covid-19 Vaccination Clinic  Name:  William Noble    MRN: [id number] DOB: [birth year]  11/13/2019  Mr. William Noble was observed post Covid-19 immunization for 15 minutes without incident. He was provided with Vaccine Information Sheet and instruction to access the V-Safe system.   Mr. William Noble was instructed to call 911 with any severe reactions post vaccine: Marland Kitchen Difficulty breathing  . Swelling of face and throat  . A fast heartbeat  . A bad rash all over body  . Dizziness and weakness   Immunizations Administered    Name Date Dose VIS Date Route   Moderna COVID-19 Vaccine 11/13/2019  1:25 PM 0.5 mL 06/2019 Intramuscular   Manufacturer: Moderna   Lot: [id number]   NDC: 45146-047-99

## 2020-08-22 ENCOUNTER — Other Ambulatory Visit: Payer: Self-pay | Admitting: Student

## 2020-08-28 ENCOUNTER — Other Ambulatory Visit: Payer: Self-pay | Admitting: Student

## 2020-08-29 ENCOUNTER — Other Ambulatory Visit: Payer: Self-pay

## 2020-08-29 ENCOUNTER — Ambulatory Visit: Payer: Self-pay | Admitting: Pharmacy Technician

## 2020-08-29 DIAGNOSIS — Z79899 Other long term (current) drug therapy: Secondary | ICD-10-CM

## 2020-08-29 NOTE — Progress Notes (Signed)
Completed [facility] application and contract.  Patient agreed to all terms of the [facility] contract.    Patient approved to receive medication assistance at [facility] until time for re-certification in 2023, and as long as eligibility criteria continues to be met.    Provided patient with community resource material based on his particular needs.    Danzell Birky J. Phelix Fudala Care Manager [facility]  

## 2020-12-03 ENCOUNTER — Other Ambulatory Visit: Payer: Self-pay

## 2020-12-04 ENCOUNTER — Other Ambulatory Visit: Payer: Self-pay

## 2020-12-05 ENCOUNTER — Other Ambulatory Visit: Payer: Self-pay

## 2020-12-11 ENCOUNTER — Other Ambulatory Visit: Payer: Self-pay

## 2020-12-12 ENCOUNTER — Other Ambulatory Visit: Payer: Self-pay

## 2020-12-15 ENCOUNTER — Other Ambulatory Visit: Payer: Self-pay

## 2020-12-16 ENCOUNTER — Other Ambulatory Visit: Payer: Self-pay

## 2020-12-17 ENCOUNTER — Other Ambulatory Visit: Payer: Self-pay

## 2020-12-19 ENCOUNTER — Other Ambulatory Visit: Payer: Self-pay

## 2020-12-19 ENCOUNTER — Other Ambulatory Visit (HOSPITAL_COMMUNITY): Payer: Self-pay

## 2020-12-23 ENCOUNTER — Other Ambulatory Visit: Payer: Self-pay

## 2021-01-02 ENCOUNTER — Other Ambulatory Visit: Payer: Self-pay

## 2021-04-21 ENCOUNTER — Other Ambulatory Visit: Payer: Self-pay

## 2021-04-22 ENCOUNTER — Other Ambulatory Visit: Payer: Self-pay

## 2021-04-27 ENCOUNTER — Other Ambulatory Visit: Payer: Self-pay

## 2021-04-29 ENCOUNTER — Other Ambulatory Visit: Payer: Self-pay

## 2021-06-01 ENCOUNTER — Other Ambulatory Visit: Payer: Self-pay

## 2021-11-27 ENCOUNTER — Other Ambulatory Visit: Payer: Self-pay

## 2021-12-13 ENCOUNTER — Other Ambulatory Visit: Payer: Self-pay

## 2021-12-13 ENCOUNTER — Observation Stay: Admission: EM | Admit: 2021-12-13 | Discharge: 2021-12-14 | Attending: Internal Medicine | Admitting: Internal Medicine

## 2021-12-13 DIAGNOSIS — I160 Hypertensive urgency: Secondary | ICD-10-CM | POA: Insufficient documentation

## 2021-12-13 DIAGNOSIS — Z955 Presence of coronary angioplasty implant and graft: Secondary | ICD-10-CM | POA: Insufficient documentation

## 2021-12-13 DIAGNOSIS — R079 Chest pain, unspecified: Secondary | ICD-10-CM | POA: Diagnosis present

## 2021-12-13 DIAGNOSIS — E785 Hyperlipidemia, unspecified: Secondary | ICD-10-CM | POA: Diagnosis not present

## 2021-12-13 DIAGNOSIS — I7409 Other arterial embolism and thrombosis of abdominal aorta: Secondary | ICD-10-CM

## 2021-12-13 DIAGNOSIS — I10 Essential (primary) hypertension: Secondary | ICD-10-CM | POA: Insufficient documentation

## 2021-12-13 DIAGNOSIS — I77811 Abdominal aortic ectasia: Secondary | ICD-10-CM | POA: Insufficient documentation

## 2021-12-13 DIAGNOSIS — Z79899 Other long term (current) drug therapy: Secondary | ICD-10-CM | POA: Insufficient documentation

## 2021-12-13 DIAGNOSIS — R55 Syncope and collapse: Secondary | ICD-10-CM | POA: Insufficient documentation

## 2021-12-13 DIAGNOSIS — R0789 Other chest pain: Principal | ICD-10-CM | POA: Insufficient documentation

## 2021-12-13 DIAGNOSIS — Z87891 Personal history of nicotine dependence: Secondary | ICD-10-CM | POA: Diagnosis not present

## 2021-12-13 DIAGNOSIS — I2510 Atherosclerotic heart disease of native coronary artery without angina pectoris: Secondary | ICD-10-CM | POA: Insufficient documentation

## 2021-12-13 DIAGNOSIS — I719 Aortic aneurysm of unspecified site, without rupture: Secondary | ICD-10-CM

## 2021-12-13 HISTORY — DX: Essential (primary) hypertension: I10

## 2021-12-13 LAB — CBC WITH DIFFERENTIAL/PLATELET
Abs Immature Granulocytes: 0.05 10*3/uL (ref 0.00–0.07)
Basophils Absolute: 0.1 10*3/uL (ref 0.0–0.1)
Basophils Relative: 0 %
Eosinophils Absolute: 0.2 10*3/uL (ref 0.0–0.5)
Eosinophils Relative: 2 %
HCT: 50.5 % (ref 39.0–52.0)
Hemoglobin: 16.8 g/dL (ref 13.0–17.0)
Immature Granulocytes: 0 %
Lymphocytes Relative: 23 %
Lymphs Abs: 2.8 10*3/uL (ref 0.7–4.0)
MCH: 29.6 pg (ref 26.0–34.0)
MCHC: 33.3 g/dL (ref 30.0–36.0)
MCV: 89.1 fL (ref 80.0–100.0)
Monocytes Absolute: 0.7 10*3/uL (ref 0.1–1.0)
Monocytes Relative: 6 %
Neutro Abs: 8.5 10*3/uL — ABNORMAL HIGH (ref 1.7–7.7)
Neutrophils Relative %: 69 %
Platelets: 240 10*3/uL (ref 150–400)
RBC: 5.67 MIL/uL (ref 4.22–5.81)
RDW: 12.3 % (ref 11.5–15.5)
WBC: 12.3 10*3/uL — ABNORMAL HIGH (ref 4.0–10.5)
nRBC: 0 % (ref 0.0–0.2)

## 2021-12-13 MED ORDER — NITROGLYCERIN 0.4 MG SL SUBL
0.4000 mg | SUBLINGUAL_TABLET | SUBLINGUAL | Status: AC | PRN
  Administered 2021-12-13 – 2021-12-14 (×3): 0.4 mg via SUBLINGUAL
  Filled 2021-12-13: qty 1

## 2021-12-13 NOTE — ED Provider Notes (Addendum)
[facility] Provider Note    Event Date/Time   First MD Initiated Contact with Patient 12/13/21 2308     (approximate)   History   Loss of Consciousness and Chest Pain   HPI  William Noble is a 61 y.o. male with history of hypertension, hepatitis C who [facility] after he had a syncopal event.  The patient states he has had central chest pressure that radiated into his neck today.  He is not able to tell me if this started before after he passed out.  Denies shortness of breath, nausea, vomiting but states he has had lightheadedness and diaphoresis.  No fevers or cough.  No history of PE or DVT.  States he has had 2 stents placed about 5 years ago in [facility].  He does not think that he is on any blood thinners.  Confirmed with staff at the jail that he has only been there for about a week and is only taking hydrochlorothiazide.  First responders at scene gave Narcan but it is unclear if there was any response.  There is previous history of heroin abuse.  History provided by EMS, patient, Field seismologist at bedside.    Past Medical History:  Diagnosis Date   Hepatitis C     No past surgical history on file.  MEDICATIONS:  Prior to Admission medications   Medication Sig Start Date End Date Taking? Authorizing Provider  atorvastatin (LIPITOR) 80 MG tablet TAKE ONE TABLET BY MOUTH EVERY DAY 08/28/20 08/28/21  Richardo Priest, MD  fenofibrate (TRICOR) 145 MG tablet TAKE ONE TABLET BY MOUTH EVERY DAY 08/22/20 01/22/21  Richardo Priest, MD  lisinopril (ZESTRIL) 40 MG tablet TAKE ONE TABLET BY MOUTH EVERY DAY 08/22/20 08/22/21  Richardo Priest, MD  metoprolol tartrate (LOPRESSOR) 25 MG tablet TAKE ONE TABLET BY MOUTH 2 TIMES A DAY 08/22/20 11/22/20  Richardo Priest, MD    Physical Exam   Triage Vital Signs: ED Triage Vitals  Enc Vitals Group     BP 12/13/21 2313 (!) 219/132     Pulse Rate 12/13/21 2313 62     Resp  12/13/21 2313 16     Temp 12/13/21 2313 97.9 F (36.6 C)     Temp Source 12/13/21 2313 Oral     SpO2 12/13/21 2313 98 %     Weight 12/13/21 2315 200 lb (90.7 kg)     Height 12/13/21 2315  (1.778 m)     Head Circumference --      Peak Flow --      Pain Score 12/13/21 2314 10     Pain Loc --      Pain Edu? --      Excl. in GC? --     Most recent vital signs: Vitals:   12/14/21 0100 12/14/21 0200  BP: (!) 161/102 (!) 148/86  Pulse: (!) 58 65  Resp: (!) 21 19  Temp:    SpO2: 97% 98%    CONSTITUTIONAL: Alert and oriented and responds appropriately to questions.  Chronically ill-appearing HEAD: Normocephalic, atraumatic EYES: Conjunctivae clear, pupils appear equal, sclera nonicteric ENT: normal nose; moist mucous membranes NECK: Supple, normal ROM no midline spinal tenderness or step-off or deformity CARD: RRR; S1 and S2 appreciated; no murmurs, no clicks, no rubs, no gallops RESP: Normal chest excursion without splinting or tachypnea; breath sounds clear and equal bilaterally; no wheezes, no rhonchi, no rales, no hypoxia  or respiratory distress, speaking full sentences ABD/GI: Normal bowel sounds; non-distended; soft, non-tender, no rebound, no guarding, no peritoneal signs BACK: The back appears normal no midline spinal tenderness or step-off or deformity EXT: Normal ROM in all joints; no deformity noted, no edema; no cyanosis, no calf tenderness or calf swelling SKIN: Normal color for age and race; warm; no rash on exposed skin NEURO: Moves all extremities equally, normal speech, no facial asymmetry PSYCH: The patient's mood and manner are appropriate.   ED Results / Procedures / Treatments   LABS: (all labs ordered are listed, but only abnormal results are displayed) Labs Reviewed  CBC WITH DIFFERENTIAL/PLATELET - Abnormal; Notable for the following components:      Result Value   WBC 12.3 (*)    Neutro Abs 8.5 (*)    All other components within normal limits   BASIC METABOLIC PANEL - Abnormal; Notable for the following components:   BUN 22 (*)    All other components within normal limits  TROPONIN I (HIGH SENSITIVITY) - Abnormal; Notable for the following components:   Troponin I (High Sensitivity) 18 (*)    All other components within normal limits  PROTIME-INR  MAGNESIUM  HIV ANTIBODY (ROUTINE TESTING W REFLEX)  BASIC METABOLIC PANEL  CBC  TROPONIN I (HIGH SENSITIVITY)  TROPONIN I (HIGH SENSITIVITY)     EKG:  EKG Interpretation  Date/Time:  Sunday Dec 13 2021 23:05:48 EDT Ventricular Rate:  62 PR Interval:  177 QRS Duration: 99 QT Interval:  423 QTC Calculation: 430 R Axis:   58 Text Interpretation: Sinus rhythm Abnormal T, consider ischemia, diffuse leads No old tracing to compare Confirmed by Johnell Bas, Baxter Hire [phone]) on 12/13/2021 11:11:21 PM         RADIOLOGY: My personal review and interpretation of imaging: CT scan shows 7 mm penetrating ulcer of the lower aorta but no dissection or aneurysm.  I have personally reviewed all radiology reports.   CT Angio Chest/Abd/Pel for Dissection W and/or Wo Contrast  Result Date: 12/14/2021 CLINICAL DATA:  Acute aortic syndrome suspected. EXAM: CT ANGIOGRAPHY CHEST, ABDOMEN AND PELVIS TECHNIQUE: Non-contrast CT of the chest was initially obtained. Multidetector CT imaging through the chest, abdomen and pelvis was performed using the standard protocol during bolus administration of intravenous contrast. Multiplanar reconstructed images and MIPs were obtained and reviewed to evaluate the vascular anatomy. RADIATION DOSE REDUCTION: This exam was performed according to the departmental dose-optimization program which includes automated exposure control, adjustment of the mA and/or kV according to patient size and/or use of iterative reconstruction technique. CONTRAST:  OMNIPAQUE IOHEXOL 350 MG/ML SOLN COMPARISON:  None Available. FINDINGS: CTA CHEST FINDINGS Cardiovascular: There is no  cardiomegaly or pericardial effusion. Mild atherosclerotic calcification of the thoracic aorta. No aneurysmal dilatation or dissection. The origins of the great vessels of the aortic arch appear patent as visualized. No pulmonary artery embolus identified. Mediastinum/Nodes: No hilar or mediastinal adenopathy. The esophagus and the thyroid gland are grossly unremarkable. No mediastinal fluid collection. Lungs/Pleura: The lungs are clear. There is no pleural effusion or pneumothorax. The central airways are patent. Musculoskeletal: No acute osseous pathology. Review of the MIP images confirms the above findings. CTA ABDOMEN AND PELVIS FINDINGS VASCULAR Aorta: Moderate calcified and noncalcified plaque. 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 No aortic dissection. No periaortic fluid collection. Celiac: Patent without evidence of aneurysm, dissection, vasculitis or significant stenosis. SMA: Patent without evidence of aneurysm, dissection,  vasculitis or significant stenosis. Renals: Both renal arteries are patent without evidence of aneurysm, dissection, vasculitis, fibromuscular dysplasia or significant stenosis. IMA: The IMA is not identified with certainty. Inflow: Moderate atherosclerotic calcification. No aneurysmal dilatation. The 80 Ack arteries remain patent. Veins: No obvious venous abnormality within the limitations of this arterial phase study. Review of the MIP images confirms the above findings. NON-VASCULAR No intra-abdominal free air or free fluid. Hepatobiliary: No focal liver abnormality is seen. No gallstones, gallbladder wall thickening, or biliary dilatation. Pancreas: Unremarkable. No pancreatic ductal dilatation or surrounding inflammatory changes. Spleen: Normal in size without focal abnormality. Adrenals/Urinary Tract: Adrenal glands are unremarkable. Kidneys are normal, without renal calculi, focal lesion, or hydronephrosis.  Bladder is unremarkable. Stomach/Bowel: Moderate stool throughout the colon. No bowel obstruction or active inflammation. The appendix is not visualized with certainty. No inflammatory changes identified in the right lower quadrant. Lymphatic: No adenopathy. Reproductive: The prostate and seminal vesicles are grossly unremarkable. Small bilateral hydroceles. Other: None Musculoskeletal: Degenerative changes of the spine. No acute osseous pathology. Review of the MIP images confirms the above findings. IMPRESSION: 1. No acute intrathoracic, abdominal, or pelvic pathology. No aortic dissection. 2. 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 Recommend follow-up ultrasound every 5 years. This recommendation follows ACR consensus guidelines: White Paper of the ACR Incidental Findings Committee II on Vascular Findings. J Am Coll Radiol 2013; 10:789-794. Electronically Signed   By: Elgie CollardArash  Radparvar M.D.   On: 12/14/2021 01:00     PROCEDURES:  Critical Care performed: Yes, see critical care procedure note(s)   CRITICAL CARE Performed by: Rochele RaringKristen Ledonna Dormer   Total critical care time: 55 minutes  Critical care time was exclusive of separately billable procedures and treating other patients.  Critical care was necessary to treat or prevent imminent or life-threatening deterioration.  Critical care was time spent personally by me on the following activities: development of treatment plan with patient and/or surrogate as well as nursing, discussions with consultants, evaluation of patient's response to treatment, examination of patient, obtaining history from patient or surrogate, ordering and performing treatments and interventions, ordering and review of laboratory studies, ordering and review of radiographic studies, pulse oximetry and re-evaluation of patient's condition.   Marland Kitchen.1-3 Lead EKG Interpretation Performed by: Rhiannon Sassaman, Layla MawKristen N, DO Authorized by: Analaura Messler,  Layla MawKristen N, DO     Interpretation: normal     ECG rate:  65   ECG rate assessment: normal     Rhythm: sinus rhythm     Ectopy: none     Conduction: normal      IMPRESSION / MDM / ASSESSMENT AND PLAN / ED COURSE  I reviewed the triage vital signs and the nursing notes.    Patient here with concerning chest pain.  Multiple risk factors for ACS and an abnormal EKG with no old for comparison.  The patient is on the cardiac monitor to evaluate for evidence of arrhythmia and/or significant heart rate changes.   DIFFERENTIAL DIAGNOSIS (includes but not limited to):   ACS, PE, dissection, hypertensive urgency, hypertensive emergency, CHF, pneumonia, pneumothorax   PLAN: We will obtain CBC, BMP, troponin x2, CTA of the chest, abdomen pelvis.  Will give nitroglycerin to help with his chest pain and also try to bring his blood pressure down and as it is in the 230s/130s now.   MEDICATIONS GIVEN IN ED: Medications  hydrochlorothiazide (HYDRODIURIL) tablet 25 mg (has no administration in time range)  sodium chloride flush (  NS) 0.9 % injection 3 mL (has no administration in time range)  enoxaparin (LOVENOX) injection 40 mg (has no administration in time range)  0.9 % NaCl with KCl 20 mEq/ L  infusion (has no administration in time range)  acetaminophen (TYLENOL) tablet 650 mg (has no administration in time range)    Or  acetaminophen (TYLENOL) suppository 650 mg (has no administration in time range)  magnesium hydroxide (MILK OF MAGNESIA) suspension 30 mL (has no administration in time range)  ondansetron (ZOFRAN) tablet 4 mg (has no administration in time range)    Or  ondansetron (ZOFRAN) injection 4 mg (has no administration in time range)  traZODone (DESYREL) tablet 25 mg (has no administration in time range)  nitroGLYCERIN (NITROSTAT) SL tablet 0.4 mg (0.4 mg Sublingual Given 12/14/21 0001)  nitroGLYCERIN (NITROGLYN) 2 % ointment 1 inch (1 inch Topical Given 12/14/21 0056)  iohexol  (OMNIPAQUE) 350 MG/ML injection 100 mL (100 mLs Intravenous Contrast Given 12/14/21 0022)  hydrALAZINE (APRESOLINE) injection 10 mg (10 mg Intravenous Given 12/14/21 0121)     ED COURSE: Patient's labs show mild leukocytosis without any infectious symptoms.  Normal renal function.  First troponin is 18.  He has had some bigeminy on cardiac monitoring but has normal electrolytes.  Reports chest pain completely resolved after 3 nitroglycerin.  Will place an inch of Nitropaste.  Blood pressure slowly improving.  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  We will aggressively work on getting his blood pressure down given this finding.  I feel he will need admission for chest pain rule out given his abnormal EKG, multiple risk factors and likely vascular surgery consultation in the morning.  Patient agrees with this plan.  Will discuss with hospitalist.   Blood pressures have improved with hydralazine.  CONSULTS:  Consulted and discussed patient's case with hospitalist, Dr. Arville Care.  I have recommended admission and consulting physician agrees and will place admission orders.  Patient (and family if present) agree with this plan.   I reviewed all nursing notes, vitals, pertinent previous records.  All labs, EKGs, imaging ordered have been independently reviewed and interpreted by myself.    OUTSIDE RECORDS REVIEWED: Attempting to get records from [facility].         FINAL CLINICAL IMPRESSION(S) / ED DIAGNOSES   Final diagnoses:  Chest pain, unspecified type  Syncope and collapse  Thrombosis, aorta, abdominal (HCC)  Penetrating ulcer of aorta (HCC)     Rx / DC Orders   ED Discharge Orders     None        Note:  This document was prepared using Dragon voice recognition software and may include unintentional  dictation errors.   Cleota Pellerito, Layla Maw, DO 12/14/21 0240    Markel Mergenthaler, Layla Maw, DO 12/14/21 0300

## 2021-12-13 NOTE — ED Triage Notes (Signed)
[facility]. 8 mg intranasal narcan given. 20 G left AC. EMS reports intermittent Bigeminy with non-perfusing PVC's and perfusing rate of 30. 500 ml NS bolus given. Patient AOX4, resp even, unlabored on RA. Reports chest pain and shortness of breath.

## 2021-12-14 ENCOUNTER — Emergency Department

## 2021-12-14 ENCOUNTER — Observation Stay (HOSPITAL_BASED_OUTPATIENT_CLINIC_OR_DEPARTMENT_OTHER)
Admit: 2021-12-14 | Discharge: 2021-12-14 | Disposition: A | Discharge status: Home / Self Care | Attending: Cardiology | Admitting: Cardiology

## 2021-12-14 ENCOUNTER — Encounter: Payer: Self-pay | Admitting: Family Medicine

## 2021-12-14 DIAGNOSIS — I160 Hypertensive urgency: Secondary | ICD-10-CM

## 2021-12-14 DIAGNOSIS — E785 Hyperlipidemia, unspecified: Secondary | ICD-10-CM

## 2021-12-14 DIAGNOSIS — R55 Syncope and collapse: Secondary | ICD-10-CM | POA: Diagnosis not present

## 2021-12-14 DIAGNOSIS — I7409 Other arterial embolism and thrombosis of abdominal aorta: Secondary | ICD-10-CM | POA: Diagnosis not present

## 2021-12-14 DIAGNOSIS — I77811 Abdominal aortic ectasia: Secondary | ICD-10-CM

## 2021-12-14 DIAGNOSIS — R079 Chest pain, unspecified: Secondary | ICD-10-CM | POA: Diagnosis present

## 2021-12-14 DIAGNOSIS — I2510 Atherosclerotic heart disease of native coronary artery without angina pectoris: Secondary | ICD-10-CM

## 2021-12-14 LAB — HIV ANTIBODY (ROUTINE TESTING W REFLEX): HIV Screen 4th Generation wRfx: NONREACTIVE

## 2021-12-14 LAB — BASIC METABOLIC PANEL
Anion gap: 6 (ref 5–15)
Anion gap: 8 (ref 5–15)
Anion gap: 9 (ref 5–15)
BUN: 17 mg/dL (ref 6–20)
BUN: 19 mg/dL (ref 6–20)
BUN: 22 mg/dL — ABNORMAL HIGH (ref 6–20)
CO2: 25 mmol/L (ref 22–32)
CO2: 29 mmol/L (ref 22–32)
CO2: 30 mmol/L (ref 22–32)
Calcium: 8.6 mg/dL — ABNORMAL LOW (ref 8.9–10.3)
Calcium: 8.9 mg/dL (ref 8.9–10.3)
Calcium: 9.3 mg/dL (ref 8.9–10.3)
Chloride: 102 mmol/L (ref 98–111)
Chloride: 103 mmol/L (ref 98–111)
Chloride: 105 mmol/L (ref 98–111)
Creatinine, Ser: 0.98 mg/dL (ref 0.61–1.24)
Creatinine, Ser: 1.11 mg/dL (ref 0.61–1.24)
Creatinine, Ser: 1.18 mg/dL (ref 0.61–1.24)
GFR, Estimated: >60 mL/min (ref >60)
GFR, Estimated: >60 mL/min (ref >60)
GFR, Estimated: >60 mL/min (ref >60)
Glucose, Bld: 103 mg/dL — ABNORMAL HIGH (ref 70–99)
Glucose, Bld: 78 mg/dL (ref 70–99)
Glucose, Bld: 81 mg/dL (ref 70–99)
Potassium: 3.3 mmol/L — ABNORMAL LOW (ref 3.5–5.1)
Potassium: 3.5 mmol/L (ref 3.5–5.1)
Potassium: 3.6 mmol/L (ref 3.5–5.1)
Sodium: 138 mmol/L (ref 135–145)
Sodium: 139 mmol/L (ref 135–145)
Sodium: 140 mmol/L (ref 135–145)

## 2021-12-14 LAB — TSH: TSH: 0.448 u[IU]/mL (ref 0.350–4.500)

## 2021-12-14 LAB — LIPID PANEL
Cholesterol: 245 mg/dL — ABNORMAL HIGH (ref 0–200)
HDL: 42 mg/dL (ref >40)
LDL Cholesterol: 191 mg/dL — ABNORMAL HIGH (ref 0–99)
Total CHOL/HDL Ratio: 5.8 RATIO
Triglycerides: 59 mg/dL (ref <150)
VLDL: 12 mg/dL (ref 0–40)

## 2021-12-14 LAB — HEMOGLOBIN A1C
Hgb A1c MFr Bld: 5.7 % — ABNORMAL HIGH (ref 4.8–5.6)
Mean Plasma Glucose: 116.89 mg/dL

## 2021-12-14 LAB — HEPATIC FUNCTION PANEL
ALT: 20 U/L (ref 0–44)
AST: 20 U/L (ref 15–41)
Albumin: 3.8 g/dL (ref 3.5–5.0)
Alkaline Phosphatase: 73 U/L (ref 38–126)
Bilirubin, Direct: <0.1 mg/dL (ref 0.0–0.2)
Total Bilirubin: 0.8 mg/dL (ref 0.3–1.2)
Total Protein: 6.9 g/dL (ref 6.5–8.1)

## 2021-12-14 LAB — PROTIME-INR
INR: 1 (ref 0.8–1.2)
Prothrombin Time: 13.1 seconds (ref 11.4–15.2)

## 2021-12-14 LAB — CBC
HCT: 47.2 % (ref 39.0–52.0)
Hemoglobin: 15.8 g/dL (ref 13.0–17.0)
MCH: 29.6 pg (ref 26.0–34.0)
MCHC: 33.5 g/dL (ref 30.0–36.0)
MCV: 88.4 fL (ref 80.0–100.0)
Platelets: 253 10*3/uL (ref 150–400)
RBC: 5.34 MIL/uL (ref 4.22–5.81)
RDW: 12.1 % (ref 11.5–15.5)
WBC: 15 10*3/uL — ABNORMAL HIGH (ref 4.0–10.5)
nRBC: 0 % (ref 0.0–0.2)

## 2021-12-14 LAB — ECHOCARDIOGRAM COMPLETE
AR max vel: 4.89 cm2
AV Area VTI: 5.23 cm2
AV Area mean vel: 4.33 cm2
AV Mean grad: 6 mmHg
AV Peak grad: 10.5 mmHg
Ao pk vel: 1.62 m/s
Area-P 1/2: 2.97 cm2
Height: 70 in
MV VTI: 4.8 cm2
S' Lateral: 2.63 cm
Weight: 2724.8 oz

## 2021-12-14 LAB — MAGNESIUM: Magnesium: 2.2 mg/dL (ref 1.7–2.4)

## 2021-12-14 LAB — TROPONIN I (HIGH SENSITIVITY)
Troponin I (High Sensitivity): 18 ng/L — ABNORMAL HIGH (ref <18)
Troponin I (High Sensitivity): 20 ng/L — ABNORMAL HIGH (ref <18)
Troponin I (High Sensitivity): 20 ng/L — ABNORMAL HIGH (ref <18)
Troponin I (High Sensitivity): 21 ng/L — ABNORMAL HIGH (ref <18)

## 2021-12-14 LAB — GLUCOSE, CAPILLARY: Glucose-Capillary: 97 mg/dL (ref 70–99)

## 2021-12-14 LAB — MRSA NEXT GEN BY PCR, NASAL: MRSA by PCR Next Gen: NOT DETECTED

## 2021-12-14 MED ORDER — ATORVASTATIN CALCIUM 80 MG PO TABS: 80.0000 mg | ORAL_TABLET | Freq: Every evening | ORAL | 2 refills | Status: AC

## 2021-12-14 MED ORDER — POTASSIUM CHLORIDE CRYS ER 20 MEQ PO TBCR
40.0000 meq | EXTENDED_RELEASE_TABLET | Freq: Once | ORAL | Status: AC
Start: 2021-12-14 — End: 2021-12-14
  Administered 2021-12-14: 40 meq via ORAL
  Filled 2021-12-14: qty 2

## 2021-12-14 MED ORDER — HYDROCHLOROTHIAZIDE 25 MG PO TABS
25.0000 mg | ORAL_TABLET | Freq: Every day | ORAL | Status: DC
  Administered 2021-12-14: 25 mg via ORAL
  Filled 2021-12-14: qty 1

## 2021-12-14 MED ORDER — MORPHINE SULFATE (PF) 2 MG/ML IV SOLN
2.0000 mg | INTRAVENOUS | Status: DC | PRN
Start: 2021-12-14 — End: 2021-12-14

## 2021-12-14 MED ORDER — ONDANSETRON HCL 4 MG/2ML IJ SOLN: 4.0000 mg | Freq: Four times a day (QID) | INTRAMUSCULAR | Status: DC | PRN

## 2021-12-14 MED ORDER — ACETAMINOPHEN 325 MG PO TABS: 650.0000 mg | ORAL_TABLET | Freq: Four times a day (QID) | ORAL | Status: DC | PRN

## 2021-12-14 MED ORDER — TRAZODONE HCL 50 MG PO TABS: 25.0000 mg | ORAL_TABLET | Freq: Every evening | ORAL | Status: DC | PRN

## 2021-12-14 MED ORDER — POTASSIUM CHLORIDE IN NACL 20-0.9 MEQ/L-% IV SOLN
INTRAVENOUS | Status: DC
  Filled 2021-12-14 (×3): qty 1000

## 2021-12-14 MED ORDER — METOPROLOL TARTRATE 25 MG PO TABS
12.5000 mg | ORAL_TABLET | Freq: Two times a day (BID) | ORAL | Status: DC
  Administered 2021-12-14: 12.5 mg via ORAL
  Filled 2021-12-14: qty 1

## 2021-12-14 MED ORDER — ASPIRIN 81 MG PO CHEW: 81.0000 mg | CHEWABLE_TABLET | Freq: Every day | ORAL | 1 refills | Status: AC

## 2021-12-14 MED ORDER — METOPROLOL TARTRATE 25 MG PO TABS
12.5000 mg | ORAL_TABLET | Freq: Two times a day (BID) | ORAL | 1 refills | Status: AC
Start: 2021-12-14 — End: ?

## 2021-12-14 MED ORDER — IOHEXOL 350 MG/ML SOLN
100.0000 mL | Freq: Once | INTRAVENOUS | Status: AC | PRN
  Administered 2021-12-14: 100 mL via INTRAVENOUS

## 2021-12-14 MED ORDER — ENOXAPARIN SODIUM 40 MG/0.4ML IJ SOSY
40.0000 mg | PREFILLED_SYRINGE | INTRAMUSCULAR | Status: DC
  Administered 2021-12-14: 40 mg via SUBCUTANEOUS
  Filled 2021-12-14: qty 0.4

## 2021-12-14 MED ORDER — MAGNESIUM HYDROXIDE 400 MG/5ML PO SUSP: 30.0000 mL | Freq: Every day | ORAL | Status: DC | PRN

## 2021-12-14 MED ORDER — NITROGLYCERIN 2 % TD OINT
1.0000 [in_us] | TOPICAL_OINTMENT | Freq: Once | TRANSDERMAL | Status: AC
  Administered 2021-12-14: 1 [in_us] via TOPICAL
  Filled 2021-12-14: qty 1

## 2021-12-14 MED ORDER — ONDANSETRON HCL 4 MG PO TABS: 4.0000 mg | ORAL_TABLET | Freq: Four times a day (QID) | ORAL | Status: DC | PRN

## 2021-12-14 MED ORDER — ATORVASTATIN CALCIUM 80 MG PO TABS
80.0000 mg | ORAL_TABLET | Freq: Every evening | ORAL | Status: DC
Start: 2021-12-14 — End: 2021-12-14

## 2021-12-14 MED ORDER — ASPIRIN 81 MG PO CHEW
81.0000 mg | CHEWABLE_TABLET | Freq: Every day | ORAL | Status: DC
  Administered 2021-12-14: 81 mg via ORAL
  Filled 2021-12-14: qty 1

## 2021-12-14 MED ORDER — HYDRALAZINE HCL 20 MG/ML IJ SOLN
10.0000 mg | Freq: Once | INTRAMUSCULAR | Status: AC
  Administered 2021-12-14: 10 mg via INTRAVENOUS
  Filled 2021-12-14: qty 1

## 2021-12-14 MED ORDER — ACETAMINOPHEN 650 MG RE SUPP: 650.0000 mg | Freq: Four times a day (QID) | RECTAL | Status: DC | PRN

## 2021-12-14 MED ORDER — SODIUM CHLORIDE 0.9% FLUSH
3.0000 mL | Freq: Two times a day (BID) | INTRAVENOUS | Status: DC
  Administered 2021-12-14 (×2): 3 mL via INTRAVENOUS

## 2021-12-14 MED ORDER — LABETALOL HCL 5 MG/ML IV SOLN
20.0000 mg | INTRAVENOUS | Status: DC | PRN
Start: 2021-12-14 — End: 2021-12-14

## 2021-12-14 NOTE — Assessment & Plan Note (Signed)
-   He will be continued on his antihypertensives. - We will place him on as needed IV labetalol.

## 2021-12-14 NOTE — ED Notes (Signed)
Faxed Medical consent to [facility]).

## 2021-12-14 NOTE — Assessment & Plan Note (Addendum)
-   The patient will be admitted to an observation cardiac telemetry bed. - We will follow serial troponins. - He will be placed on aspirin as well as as needed sublingual nitroglycerin and morphine sulfate for pain. - Cardiology consult will be obtained. - I notified Dr. Juliann Pares about the patient.

## 2021-12-14 NOTE — ED Notes (Signed)
Pt given a cup of ice water

## 2021-12-14 NOTE — ED Notes (Signed)
Called [facility]) to check status of medical records being faxed over. Darl Pikes in the ED said that the [facility] is working on it.

## 2021-12-14 NOTE — Assessment & Plan Note (Signed)
-   We will continue statin therapy. 

## 2021-12-14 NOTE — H&P (Signed)
[place]   PATIENT NAME: William Noble    MR#:  [id number]  DATE OF BIRTH:  1960-12-22  DATE OF ADMISSION:  12/13/2021  PRIMARY CARE PHYSICIAN: Pcp, No   [facility] is [facility] from: [facility]  REQUESTING/REFERRING PHYSICIAN: Ward, Delice Bison, DO  CHIEF COMPLAINT:   Chief Complaint  Patient presents with   Loss of Consciousness   Chest Pain    HISTORY OF PRESENT ILLNESS:  Kelian Adriance is a 61 y.o. male with medical history significant for hepatitis C and tobacco abuse as well as coronary artery disease status post PCI and stent, who presented to the emergency room with acute onset of syncope followed by central chest pain felt as pressure with associated diaphoresis without nausea or vomiting or dyspnea.  He denies any cough or wheezing or hemoptysis.  No leg pain or edema or recent travels or surgeries.  No bleeding diathesis.  No dysuria, oliguria or hematuria or flank pain.  ED Course: Upon presentation to the emergency room, BP was 219/132 with otherwise normal vital signs.  Labs revealed potassium of 3.6 and high sensitive troponin I was 18 and later 20 then 21.  CBC showed WBC of 12.3. EKG as reviewed by me : EKG showed sinus rhythm with a rate of 62 with poor R wave progression and T wave inversion laterally and inferiorly Imaging: Chest CTA revealed no acute intrathoracic, abdominal or pelvic pathology and no aortic dissection.  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  The patient was given 10 mg of IV hydralazine, 0.4 mg sublingual nitroglycerin and 1 inch of Nitropaste.  He will be admitted to a cardiac telemetry observation bed for further evaluation and management. PAST MEDICAL HISTORY:   Past Medical History:  Diagnosis Date   Hepatitis C    Hypertension   -Tobacco abuse - Coronary artery disease status post PCI and  stent  PAST SURGICAL HISTORY:    Cardiac PCI and stent  SOCIAL HISTORY:   Social History   Tobacco Use   Smoking status: Former    Packs/day: 0.50    Types: Cigarettes   Smokeless tobacco: Not on file  Substance Use Topics   Alcohol use: Not Currently    FAMILY HISTORY:   Positive coronary artery disease, hypertension and cancer  DRUG ALLERGIES:  No Known Allergies  REVIEW OF SYSTEMS:   ROS As per history of present illness. All pertinent systems were reviewed above. Constitutional, HEENT, cardiovascular, respiratory, GI, GU, musculoskeletal, neuro, psychiatric, endocrine, integumentary and hematologic systems were reviewed and are otherwise negative/unremarkable except for positive findings mentioned above in the HPI.   MEDICATIONS AT HOME:   Prior to Admission medications   Medication Sig Start Date End Date Taking? Authorizing Provider  hydrochlorothiazide (HYDRODIURIL) 25 MG tablet Take 25 mg by mouth daily.   Yes [provider]      VITAL SIGNS:  Blood pressure (!) 167/118, pulse 85, temperature 98.4 F (36.9 C), temperature source Oral, resp. rate 15, height 5\' 10"  (1.778 m), weight 77.2 kg, SpO2 100 %.  PHYSICAL EXAMINATION:  Physical Exam  GENERAL:  61 y.o.-year-old patient lying in the bed with no acute distress.  EYES: Pupils equal, round, reactive to light and accommodation. No scleral icterus. Extraocular muscles intact.  HEENT: Head atraumatic, normocephalic. Oropharynx and nasopharynx clear.  NECK:  Supple, no jugular venous distention. No thyroid enlargement, no tenderness.  LUNGS: Normal breath sounds bilaterally, no wheezing, rales,rhonchi or crepitation. No use of accessory muscles of respiration.  CARDIOVASCULAR: Regular rate and rhythm, S1, S2 normal. No murmurs, rubs, or gallops.  ABDOMEN: Soft, nondistended, nontender. Bowel sounds present. No organomegaly or mass.  EXTREMITIES: No pedal edema, cyanosis, or clubbing.  NEUROLOGIC:  Cranial nerves II through XII are intact. Muscle strength 5/5 in all extremities. Sensation intact. Gait not checked.  PSYCHIATRIC: The patient is alert and oriented x 3.  Normal affect and good eye contact. SKIN: No obvious rash, lesion, or ulcer.   LABORATORY PANEL:   CBC Recent Labs  Lab 12/14/21 0440  WBC 15.0*  HGB 15.8  HCT 47.2  PLT 253   ------------------------------------------------------------------------------------------------------------------  Chemistries  Recent Labs  Lab 12/13/21 2308 12/13/21 2343 12/14/21 0440  NA  --    < > 140  K  --    < > 3.3*  CL  --    < > 102  CO2  --    < > 30  GLUCOSE  --    < > 103*  BUN  --    < > 19  CREATININE  --    < > 0.98  CALCIUM  --    < > 9.3  MG 2.2  --   --    < > = values in this interval not displayed.   ------------------------------------------------------------------------------------------------------------------  Cardiac Enzymes No results for input(s): TROPONINI in the last 168 hours. ------------------------------------------------------------------------------------------------------------------  RADIOLOGY:  CT Angio Chest/Abd/Pel for Dissection W and/or Wo Contrast  Result Date: 12/14/2021 CLINICAL DATA:  Acute aortic syndrome suspected. EXAM: CT ANGIOGRAPHY CHEST, ABDOMEN AND PELVIS TECHNIQUE: Non-contrast CT of the chest was initially obtained. Multidetector CT imaging through the chest, abdomen and pelvis was performed using the standard protocol during bolus administration of intravenous contrast. Multiplanar reconstructed images and MIPs were obtained and reviewed to evaluate the vascular anatomy. RADIATION DOSE REDUCTION: This exam was performed according to the departmental dose-optimization program which includes automated exposure control, adjustment of the mA and/or kV according to patient size and/or use of iterative reconstruction technique. CONTRAST:  155mL OMNIPAQUE IOHEXOL 350 MG/ML SOLN  COMPARISON:  None Available. FINDINGS: CTA CHEST FINDINGS Cardiovascular: There is no cardiomegaly or pericardial effusion. Mild atherosclerotic calcification of the thoracic aorta. No aneurysmal dilatation or dissection. The origins of the great vessels of the aortic arch appear patent as visualized. No pulmonary artery embolus identified. Mediastinum/Nodes: No hilar or mediastinal adenopathy. The esophagus and the thyroid gland are grossly unremarkable. No mediastinal fluid collection. Lungs/Pleura: The lungs are clear. There is no pleural effusion or pneumothorax. The central airways are patent. Musculoskeletal: No acute osseous pathology. Review of the MIP images confirms the above findings. CTA ABDOMEN AND PELVIS FINDINGS VASCULAR Aorta: Moderate calcified and noncalcified plaque. 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 No aortic dissection. No periaortic fluid collection. Celiac: Patent without evidence of aneurysm, dissection, vasculitis or significant stenosis. SMA: Patent without evidence of aneurysm, dissection, vasculitis or significant stenosis. Renals: Both renal arteries are patent without evidence of aneurysm, dissection, vasculitis, fibromuscular dysplasia or significant stenosis. IMA: The IMA is not identified with certainty. Inflow: Moderate atherosclerotic calcification. No aneurysmal dilatation. The 28 Ack arteries remain patent. Veins: No obvious venous abnormality within the limitations of this arterial phase study. Review of the MIP images confirms the above findings. NON-VASCULAR No intra-abdominal free air or free fluid. Hepatobiliary: No focal liver abnormality  is seen. No gallstones, gallbladder wall thickening, or biliary dilatation. Pancreas: Unremarkable. No pancreatic ductal dilatation or surrounding inflammatory changes. Spleen: Normal in size without focal abnormality. Adrenals/Urinary Tract: Adrenal glands are  unremarkable. Kidneys are normal, without renal calculi, focal lesion, or hydronephrosis. Bladder is unremarkable. Stomach/Bowel: Moderate stool throughout the colon. No bowel obstruction or active inflammation. The appendix is not visualized with certainty. No inflammatory changes identified in the right lower quadrant. Lymphatic: No adenopathy. Reproductive: The prostate and seminal vesicles are grossly unremarkable. Small bilateral hydroceles. Other: None Musculoskeletal: Degenerative changes of the spine. No acute osseous pathology. Review of the MIP images confirms the above findings. IMPRESSION: 1. No acute intrathoracic, abdominal, or pelvic pathology. No aortic dissection. 2. 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 Recommend follow-up ultrasound every 5 years. This recommendation follows ACR consensus guidelines: White Paper of the ACR Incidental Findings Committee II on Vascular Findings. J Am Coll Radiol 2013; 10:789-794. Electronically Signed   By: Anner Crete M.D.   On: 12/14/2021 01:00      IMPRESSION AND PLAN:  Assessment and Plan: * Chest pain - The patient will be admitted to an observation cardiac telemetry bed. - We will follow serial troponins. - He will be placed on aspirin as well as as needed sublingual nitroglycerin and morphine sulfate for pain. - Cardiology consult will be obtained. - I notified Dr. Clayborn Bigness about the patient.  Syncope, vasovagal - The patient will be followed with serial troponins as mentioned above. - We will check orthostatics. - He will be hydrated with IV normal saline.  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 - Vascular surgery consult will be obtained. - I notified Dr. Shelia Media about the patient  Hypertensive urgency - He will be continued on his antihypertensives. - We will place him on as needed IV  labetalol.  Coronary artery disease - We will continue his Plavix and add aspirin, resume beta-blocker therapy with Lopressor and ACE inhibitor therapy and statin therapy.  Dyslipidemia - We will continue statin therapy.       DVT prophylaxis: Lovenox.  Advanced Care Planning:  Code Status: full code.  Family Communication:  The plan of care was discussed in details with the patient (and family). I answered all questions. The patient agreed to proceed with the above mentioned plan. Further management will depend upon hospital course. Disposition Plan: Back to previous home environment Consults called: Cardiology. All the records are reviewed and case discussed with ED provider.  Status is: Observation   I certify that at the time of admission, it is my clinical judgment that the patient will require i hospital care extending less than 2 midnights.                            Dispo: The patient is from: [facility]al facility              Anticipated d/c is to: Facility             patient currently is not medically stable to d/c.              Difficult to place patient: No  Christel Mormon M.D on 12/14/2021 at 6:51 AM  [facility]   From 7 PM-7 AM, contact night-coverage [email or web address]  CC: Primary care physician; Pcp, No

## 2021-12-14 NOTE — Discharge Summary (Signed)
Physician Discharge Summary   [name] MRN: [id number] DOB: 12/19/1960  Admit date:     12/13/2021  Discharge date: 12/14/21  Discharge Physician: Enedina FinnerSona Walther [place]   PCP: Pcp, No   Recommendations at discharge:    F/u Dr Beatrix Fettersrgel cardiology in 3 weeks Keep log of BP   Discharge Diagnoses: Chest pain--atypical Malignant HTN Syncope--suspect with high bp  Hospital Course:  William Noble is a 61 y.o. male with medical history significant for hepatitis C and tobacco abuse as well as coronary artery disease status post PCI and stent, who presented to the emergency room with acute onset of syncope followed by central chest pain felt as pressure with associated diaphoresis without nausea or vomiting or dyspnea  EKG showed sinus rhythm with a rate of 62 with poor R wave progression and T wave inversion laterally and inferiorly  Chest pain  appears atypical/syncope -- patient seen by Dr. Beatrix Fettersrgel-- appears he is a poor historian. Some remote history of MRI in the past. No records available. Cardiac markers negative. EKG remains sinus rhythm. Patient is chest pain free. -- Cardiology recommends Zio patch to rule out arrhythmia-- discussed with jail nurse Darl PikesSusan and Tresa EndoKelly okay with patient coming with cardiac monitor. Patient will need to follow-up with Dr.Orgelin 3 to 4 weeks. - Blood pressure stable. Continue aspirin and statins.   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 - Patient will need to follow-up with PCP is outpatient to get ultrasound for follow-up's   hypertensive urgency - on hydrochlorothiazide. Added beta blockers. Blood pressure much improved  Coronary artery disease - continue aspirin, statins and beta-blocker. -- Patient poor historian about his CAD.   Dyslipidemia - continue statin therapy.  \ Patient will discharge back to the jail. Discussed with jail nurse and cardiology okay with  discharge plan      [facility] Procedures performed: Zio patch at d/c  Disposition:  Jail Diet recommendation:  Discharge Diet Orders (From admission, onward)     Start     Ordered   12/14/21 0000  Diet - low sodium heart healthy        12/14/21 1415           Cardiac diet DISCHARGE MEDICATION: Allergies as of 12/14/2021   No Known Allergies      Medication List     TAKE these medications    aspirin 81 MG chewable tablet Chew 1 tablet (81 mg total) by mouth daily. Start taking on: Dec 15, 2021   atorvastatin 80 MG tablet Commonly known as: LIPITOR Take 1 tablet (80 mg total) by mouth every evening.   hydrochlorothiazide 25 MG tablet Commonly known as: HYDRODIURIL Take 25 mg by mouth daily.   metoprolol tartrate 25 MG tablet Commonly known as: LOPRESSOR Take 0.5 tablets (12.5 mg total) by mouth 2 (two) times daily.        Follow-up Information     Armando Reichertrgel, Ryan Matthew, MD. Schedule an appointment as soon as possible for a visit in 3 week(s).   Specialty: Cardiology Why: chest pain and HTN f/u pt has zio patch Contact information: [address] [place] [address] [phone]                Discharge Exam: Ceasar MonsFiled Weights   12/13/21 2315 12/14/21 0400  Weight: 90.7 kg 77.2 kg     Condition at discharge: fair  The results of significant diagnostics from this hospitalization (including  imaging, microbiology, ancillary and laboratory) are listed below for reference.   Imaging Studies: ECHOCARDIOGRAM COMPLETE  Result Date: 12/14/2021    ECHOCARDIOGRAM REPORT   Patient Name:   William Noble Date of Exam: 12/14/2021 Medical Rec #:  [id number]        Height:       70.0 in Accession #:    [id number]       Weight:       170.3 lb Date of Birth:  02-22-1961         BSA:          1.949 m Patient Age:    61 years         BP:           131/81 mmHg Patient Gender: M                HR:           49 bpm. Exam Location:  [facility]  Procedure: 2D Echo, Cardiac Doppler and Color Doppler Indications:     R55 Syncope  History:         Patient has no prior history of Echocardiogram examinations.                  CAD, Cardiac stent; Risk Factors:Former Smoker, Hypertension                  and Dyslipidemia.  Sonographer:     Ceasar Mons Referring Phys:  [id number] Tonna Corner MICHELLE TANG Diagnosing Phys: Lorine Bears MD  Sonographer Comments: Suboptimal subcostal window. Image acquisition challenging due to respiratory motion. IMPRESSIONS  1. Left ventricular ejection fraction, by estimation, is 60 to 65%. The left ventricle has normal function. Left ventricular endocardial border not optimally defined to evaluate regional wall motion. There is moderate left ventricular hypertrophy. Left ventricular diastolic parameters were normal.  2. Right ventricular systolic function is normal. The right ventricular size is normal. Tricuspid regurgitation signal is inadequate for assessing PA pressure.  3. The mitral valve is normal in structure. No evidence of mitral valve regurgitation. No evidence of mitral stenosis.  4. The aortic valve was not well visualized. Aortic valve regurgitation is not visualized. Aortic valve sclerosis is present, with no evidence of aortic valve stenosis. FINDINGS  Left Ventricle: Left ventricular ejection fraction, by estimation, is 60 to 65%. The left ventricle has normal function. Left ventricular endocardial border not optimally defined to evaluate regional wall motion. The left ventricular internal cavity size was normal in size. There is moderate left ventricular hypertrophy. Left ventricular diastolic parameters were normal. Right Ventricle: The right ventricular size is normal. No increase in right ventricular wall thickness. Right ventricular systolic function is normal. Tricuspid regurgitation signal is inadequate for assessing PA pressure. Left Atrium: Left atrial size was normal in size. Right Atrium: Right atrial size  was normal in size. Pericardium: There is no evidence of pericardial effusion. Mitral Valve: The mitral valve is normal in structure. No evidence of mitral valve regurgitation. No evidence of mitral valve stenosis. MV peak gradient, 3.5 mmHg. The mean mitral valve gradient is 2.0 mmHg. Tricuspid Valve: The tricuspid valve is normal in structure. Tricuspid valve regurgitation is not demonstrated. No evidence of tricuspid stenosis. Aortic Valve: The aortic valve was not well visualized. Aortic valve regurgitation is not visualized. Aortic valve sclerosis is present, with no evidence of aortic valve stenosis. Aortic valve mean gradient measures 6.0 mmHg. Aortic valve peak gradient measures 10.5 mmHg. Aortic valve area, by  VTI measures 5.23 cm. Pulmonic Valve: The pulmonic valve was normal in structure. Pulmonic valve regurgitation is not visualized. No evidence of pulmonic stenosis. Aorta: The aortic root is normal in size and structure. Venous: The inferior vena cava was not well visualized. IAS/Shunts: No atrial level shunt detected by color flow Doppler.  LEFT VENTRICLE PLAX 2D LVIDd:         3.79 cm   Diastology LVIDs:         2.63 cm   LV e' medial:    7.40 cm/s LV PW:         1.49 cm   LV E/e' medial:  12.3 LV IVS:        1.42 cm   LV e' lateral:   12.80 cm/s LVOT diam:     3.00 cm   LV E/e' lateral: 7.1 LV SV:         151 LV SV Index:   77 LVOT Area:     7.07 cm  RIGHT VENTRICLE RV Basal diam:  3.89 cm RV S prime:     12.90 cm/s LEFT ATRIUM             Index        RIGHT ATRIUM           Index LA diam:        2.80 cm 1.44 cm/m   RA Area:     15.70 cm LA Vol (A2C):   53.6 ml 27.49 ml/m  RA Volume:   43.80 ml  22.47 ml/m LA Vol (A4C):   32.0 ml 16.41 ml/m LA Biplane Vol: 44.5 ml 22.83 ml/m  AORTIC VALVE                     PULMONIC VALVE AV Area (Vmax):    4.89 cm      PV Vmax:       0.99 m/s AV Area (Vmean):   4.33 cm      PV Vmean:      71.700 cm/s AV Area (VTI):     5.23 cm      PV VTI:        0.170  m AV Vmax:           162.00 cm/s   PV Peak grad:  3.9 mmHg AV Vmean:          121.000 cm/s  PV Mean grad:  2.0 mmHg AV VTI:            0.288 m AV Peak Grad:      10.5 mmHg AV Mean Grad:      6.0 mmHg LVOT Vmax:         112.00 cm/s LVOT Vmean:        74.100 cm/s LVOT VTI:          0.213 m LVOT/AV VTI ratio: 0.74  AORTA Ao Root diam: 3.30 cm MITRAL VALVE MV Area (PHT): 2.97 cm    SHUNTS MV Area VTI:   4.80 cm    Systemic VTI:  0.21 m MV Peak grad:  3.5 mmHg    Systemic Diam: 3.00 cm MV Mean grad:  2.0 mmHg MV Vmax:       0.93 m/s MV Vmean:      61.7 cm/s MV Decel Time: 255 msec MV E velocity: 91.30 cm/s MV A velocity: 98.50 cm/s MV E/A ratio:  0.93 Lorine Bears MD Electronically signed by Lorine Bears MD Signature Date/Time: 12/14/2021/12:51:01 PM  Final    CT Angio Chest/Abd/Pel for Dissection W and/or Wo Contrast  Result Date: 12/14/2021 CLINICAL DATA:  Acute aortic syndrome suspected. EXAM: CT ANGIOGRAPHY CHEST, ABDOMEN AND PELVIS TECHNIQUE: Non-contrast CT of the chest was initially obtained. Multidetector CT imaging through the chest, abdomen and pelvis was performed using the standard protocol during bolus administration of intravenous contrast. Multiplanar reconstructed images and MIPs were obtained and reviewed to evaluate the vascular anatomy. RADIATION DOSE REDUCTION: This exam was performed according to the departmental dose-optimization program which includes automated exposure control, adjustment of the mA and/or kV according to patient size and/or use of iterative reconstruction technique. CONTRAST:  OMNIPAQUE IOHEXOL 350 MG/ML SOLN COMPARISON:  None Available. FINDINGS: CTA CHEST FINDINGS Cardiovascular: There is no cardiomegaly or pericardial effusion. Mild atherosclerotic calcification of the thoracic aorta. No aneurysmal dilatation or dissection. The origins of the great vessels of the aortic arch appear patent as visualized. No pulmonary artery embolus identified. Mediastinum/Nodes:  No hilar or mediastinal adenopathy. The esophagus and the thyroid gland are grossly unremarkable. No mediastinal fluid collection. Lungs/Pleura: The lungs are clear. There is no pleural effusion or pneumothorax. The central airways are patent. Musculoskeletal: No acute osseous pathology. Review of the MIP images confirms the above findings. CTA ABDOMEN AND PELVIS FINDINGS VASCULAR Aorta: Moderate calcified and noncalcified plaque. 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 No aortic dissection. No periaortic fluid collection. Celiac: Patent without evidence of aneurysm, dissection, vasculitis or significant stenosis. SMA: Patent without evidence of aneurysm, dissection, vasculitis or significant stenosis. Renals: Both renal arteries are patent without evidence of aneurysm, dissection, vasculitis, fibromuscular dysplasia or significant stenosis. IMA: The IMA is not identified with certainty. Inflow: Moderate atherosclerotic calcification. No aneurysmal dilatation. The 80 Ack arteries remain patent. Veins: No obvious venous abnormality within the limitations of this arterial phase study. Review of the MIP images confirms the above findings. NON-VASCULAR No intra-abdominal free air or free fluid. Hepatobiliary: No focal liver abnormality is seen. No gallstones, gallbladder wall thickening, or biliary dilatation. Pancreas: Unremarkable. No pancreatic ductal dilatation or surrounding inflammatory changes. Spleen: Normal in size without focal abnormality. Adrenals/Urinary Tract: Adrenal glands are unremarkable. Kidneys are normal, without renal calculi, focal lesion, or hydronephrosis. Bladder is unremarkable. Stomach/Bowel: Moderate stool throughout the colon. No bowel obstruction or active inflammation. The appendix is not visualized with certainty. No inflammatory changes identified in the right lower quadrant. Lymphatic: No adenopathy. Reproductive:  The prostate and seminal vesicles are grossly unremarkable. Small bilateral hydroceles. Other: None Musculoskeletal: Degenerative changes of the spine. No acute osseous pathology. Review of the MIP images confirms the above findings. IMPRESSION: 1. No acute intrathoracic, abdominal, or pelvic pathology. No aortic dissection. 2. 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 Recommend follow-up ultrasound every 5 years. This recommendation follows ACR consensus guidelines: White Paper of the ACR Incidental Findings Committee II on Vascular Findings. J Am Coll Radiol 2013; 10:789-794. Electronically Signed   By: Elgie Collard M.D.   On: 12/14/2021 01:00    Microbiology: Results for orders placed or performed during the hospital encounter of 12/13/21  MRSA Next Gen by PCR, Nasal     Status: None   Collection Time: 12/14/21  8:15 AM   Specimen: Nasal Mucosa; Nasal Swab  Result Value Ref Range Status   MRSA by PCR Next Gen NOT DETECTED NOT DETECTED Final    Comment: (NOTE) The GeneXpert MRSA  Assay (FDA approved for NASAL specimens only), is one component of a comprehensive MRSA colonization surveillance program. It is not intended to diagnose MRSA infection nor to guide or monitor treatment for MRSA infections. Test performance is not FDA approved in patients less than 62 years old. Performed at [facility], [address] [place], [place] [address]     Labs: CBC: Recent Labs  Lab 12/13/21 2343 12/14/21 0440  WBC 12.3* 15.0*  NEUTROABS 8.5*  --   HGB 16.8 15.8  HCT 50.5 47.2  MCV 89.1 88.4  PLT 240 253   Basic Metabolic Panel: Recent Labs  Lab 12/13/21 2308 12/13/21 2343 12/14/21 0440  NA  --  139 140  K  --  3.6 3.3*  CL  --  105 102  CO2  --  25 30  GLUCOSE  --  81 103*  BUN  --  22* 19  CREATININE  --  1.11 0.98  CALCIUM  --  8.9 9.3  MG 2.2  --   --    Liver Function Tests: Recent Labs  Lab 12/14/21 0440   AST 20  ALT 20  ALKPHOS 73  BILITOT 0.8  PROT 6.9  ALBUMIN 3.8   CBG: Recent Labs  Lab 12/14/21 0315  GLUCAP 97    Discharge time spent: greater than 30 minutes.  Signed: Enedina Finner, MD [facility] 12/14/2021

## 2021-12-14 NOTE — Assessment & Plan Note (Signed)
-   We will continue his Plavix and add aspirin, resume beta-blocker therapy with Lopressor and ACE inhibitor therapy and statin therapy.

## 2021-12-14 NOTE — Assessment & Plan Note (Signed)
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 - Vascular surgery consult will be obtained. - I notified Dr. Renne Crigler about the patient

## 2021-12-14 NOTE — Consult Note (Signed)
[facility]  CARDIOLOGY CONSULT NOTE  Patient ID: Londell Noll MRN: [id number] DOB/AGE: [birth year] 61 y.o.  Admit date: 12/13/2021 Referring Physician Enedina Finner Primary Physician Pcp, No Primary Cardiologist None Reason for Consultation Syncope  HPI:  Dionysios Massman is a 61 yo M with h/o polysubstance abuse, hepatitis C, tobacco abuse, CAD (reportedly had PCI 5+ years ago in [place]) who presents from prison following an episode of syncope.  The patient is a very poor historian, and there is no collateral information. He states that since being placed jail ~ 1 week ago, his BP has been severely elevated since being there, and he admits to not taking any medications for the 3+ years prior to his most recent incarceration d/t lack of insurance and ongoing drug use. He was started on HCTZ and says that his BP was > 200 each time they have checked it. Yesterday he was doing well, then hear a ringing in his ears (unclear duration) and had sudden syncope. No one around to provide history regarding duration of down time. He does not spontaneously provide any additional information, but when asked if having chest pain he says it felt like his "chest was caving in" around this time, but that it is now resolved. He states he has not had chest pain since his prior MI several years ago. Cardiac ROS is otherwise negative.   On arrival to the emergency department the patient was extremely hypertensive with a blood pressure of 219/132, with a heart rate of 85.  EKG shows sinus rhythm with inferolateral T wave abnormalities and mild ST depression.  Troponin was checked 4 times and was stable at approximately 20.  Telemetry shows frequent PVCs. CTA chest abdomen pelvis showed a 2.7 cm ectasia of the distal abdominal aorta that was partially thrombosed, as well as a 7 mm penetrating ulcer.  Cardiology is consulted for evaluation of syncope and chest discomfort.  Social - Previously in  prison, released 2020. Re-incarcerated 1 week ago - Prior smoker - Current IV drug user- heroin. Used 2 weeks ago  Review of systems complete and found to be negative unless listed above     Past Medical History:  Diagnosis Date   Hepatitis C    Hypertension     History reviewed. No pertinent surgical history.  Medications Prior to Admission  Medication Sig Dispense Refill Last Dose   hydrochlorothiazide (HYDRODIURIL) 25 MG tablet Take 25 mg by mouth daily.   12/13/2021   Social History   Socioeconomic History   Marital status: Single    Spouse name: Not on file   Number of children: Not on file   Years of education: Not on file   Highest education level: Not on file  Occupational History   Not on file  Tobacco Use   Smoking status: Former    Packs/day: 0.50    Types: Cigarettes   Smokeless tobacco: Not on file  Substance and Sexual Activity   Alcohol use: Not Currently   Drug use: Yes    Types: Cocaine    Comment: Heroin   Sexual activity: Not on file  Other Topics Concern   Not on file  Social History Narrative   Not on file   Social Determinants of Health   Financial Resource Strain: Not on file  Food Insecurity: Not on file  Transportation Needs: Not on file  Physical Activity: Not on file  Stress: Not on file  Social Connections: Not on file  Intimate Partner Violence:  Not on file    History reviewed. No pertinent family history.    Review of systems complete and found to be negative unless listed above      PHYSICAL EXAM  General: Well developed, well nourished, in no acute distress HEENT:  Normocephalic and atramatic Neck:  No JVD.  Lungs: Clear bilaterally to auscultation and percussion. Heart: HRRR; frequent extra systoles. Normal S1 and S2 without gallops or murmurs.  Abdomen: Bowel sounds are positive, abdomen soft and non-tender  Msk:  Back normal, normal gait. Normal strength and tone for age. Extremities: No clubbing, cyanosis or  edema.   Neuro: Alert and oriented X 3. Psych:  Good affect, responds appropriately  Labs:   Lab Results  Component Value Date   WBC 15.0 (H) 12/14/2021   HGB 15.8 12/14/2021   HCT 47.2 12/14/2021   MCV 88.4 12/14/2021   PLT 253 12/14/2021    Recent Labs  Lab 12/14/21 0440  NA 140  K 3.3*  CL 102  CO2 30  BUN 19  CREATININE 0.98  CALCIUM 9.3  GLUCOSE 103*   No results found for: CKTOTAL, CKMB, CKMBINDEX, TROPONINI No results found for: CHOL No results found for: HDL No results found for: LDLCALC No results found for: TRIG No results found for: CHOLHDL No results found for: LDLDIRECT    Radiology: CT Angio Chest/Abd/Pel for Dissection W and/or Wo Contrast  Result Date: 12/14/2021 CLINICAL DATA:  Acute aortic syndrome suspected. EXAM: CT ANGIOGRAPHY CHEST, ABDOMEN AND PELVIS TECHNIQUE: Non-contrast CT of the chest was initially obtained. Multidetector CT imaging through the chest, abdomen and pelvis was performed using the standard protocol during bolus administration of intravenous contrast. Multiplanar reconstructed images and MIPs were obtained and reviewed to evaluate the vascular anatomy. RADIATION DOSE REDUCTION: This exam was performed according to the departmental dose-optimization program which includes automated exposure control, adjustment of the mA and/or kV according to patient size and/or use of iterative reconstruction technique. CONTRAST:  100mL OMNIPAQUE IOHEXOL 350 MG/ML SOLN COMPARISON:  None Available. FINDINGS: CTA CHEST FINDINGS Cardiovascular: There is no cardiomegaly or pericardial effusion. Mild atherosclerotic calcification of the thoracic aorta. No aneurysmal dilatation or dissection. The origins of the great vessels of the aortic arch appear patent as visualized. No pulmonary artery embolus identified. Mediastinum/Nodes: No hilar or mediastinal adenopathy. The esophagus and the thyroid gland are grossly unremarkable. No mediastinal fluid collection.  Lungs/Pleura: The lungs are clear. There is no pleural effusion or pneumothorax. The central airways are patent. Musculoskeletal: No acute osseous pathology. Review of the MIP images confirms the above findings. CTA ABDOMEN AND PELVIS FINDINGS VASCULAR Aorta: Moderate calcified and noncalcified plaque. 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 No aortic dissection. No periaortic fluid collection. Celiac: Patent without evidence of aneurysm, dissection, vasculitis or significant stenosis. SMA: Patent without evidence of aneurysm, dissection, vasculitis or significant stenosis. Renals: Both renal arteries are patent without evidence of aneurysm, dissection, vasculitis, fibromuscular dysplasia or significant stenosis. IMA: The IMA is not identified with certainty. Inflow: Moderate atherosclerotic calcification. No aneurysmal dilatation. The 80 Ack arteries remain patent. Veins: No obvious venous abnormality within the limitations of this arterial phase study. Review of the MIP images confirms the above findings. NON-VASCULAR No intra-abdominal free air or free fluid. Hepatobiliary: No focal liver abnormality is seen. No gallstones, gallbladder wall thickening, or biliary dilatation. Pancreas: Unremarkable. No pancreatic ductal dilatation or surrounding inflammatory changes. Spleen: Normal in size without focal  abnormality. Adrenals/Urinary Tract: Adrenal glands are unremarkable. Kidneys are normal, without renal calculi, focal lesion, or hydronephrosis. Bladder is unremarkable. Stomach/Bowel: Moderate stool throughout the colon. No bowel obstruction or active inflammation. The appendix is not visualized with certainty. No inflammatory changes identified in the right lower quadrant. Lymphatic: No adenopathy. Reproductive: The prostate and seminal vesicles are grossly unremarkable. Small bilateral hydroceles. Other: None Musculoskeletal: Degenerative  changes of the spine. No acute osseous pathology. Review of the MIP images confirms the above findings. IMPRESSION: 1. No acute intrathoracic, abdominal, or pelvic pathology. No aortic dissection. 2. 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 Recommend follow-up ultrasound every 5 years. This recommendation follows ACR consensus guidelines: White Paper of the ACR Incidental Findings Committee II on Vascular Findings. J Am Coll Radiol 2013; 10:789-794. Electronically Signed   By: Elgie Collard M.D.   On: 12/14/2021 01:00    EKG: Normal sinus rhythm.  Inferolateral ST/T wave abnormalities.  ASSESSMENT AND PLAN:  Rondy Krupinski is a 61 yo M with h/o polysubstance abuse, hepatitis C, tobacco abuse, CAD (reportedly had PCI 5+ years ago in [place]) who presents from prison following an episode of syncope.  # Syncope # Chest pain # Hypertensive urgency # CAD s/p PCI (unknown details, ~ 2018) # Frequent PVC's Patient presents with syncope and reported chest pain in the setting of hypertensive urgency. Unclear history given that he is such a poor historian and no collateral information. His troponin is flat ~ 20, EKG with inferolateral changes; telemetry shows frequent PVCs. Certainly could represent arrhythmogenic syncope versus related to severe hypertension. I am concerned about his non-compliance and ongoing IV drug abuse making invasive evaluation less desirable.   - Start aspirin 81 mg indefinitely - Start atorvastatin 80 mg daily - continue HCTZ 25 mg- supplement K - Start metoprolol 12.5 mg BID- PVCs - Monitor on telemetry. Recommend DC with event monitor if possible. - Echocardiogram complete - Risk factor modification labs ordered- Lipid panel, A1c, TSH - Further evaluation pending the above.   Signed: Armando Reichert MD 12/14/2021, 8:13 AM

## 2021-12-14 NOTE — Plan of Care (Signed)

## 2021-12-14 NOTE — Assessment & Plan Note (Signed)
-   The patient will be followed with serial troponins as mentioned above. - We will check orthostatics. - He will be hydrated with IV normal saline.

## 2021-12-14 NOTE — Discharge Instructions (Signed)
Keep log of BP at home °

## 2022-12-27 ENCOUNTER — Other Ambulatory Visit: Payer: Self-pay

## 2023-01-03 IMAGING — CT CT ANGIO CHEST-ABD-PELV FOR DISSECTION W/ AND WO/W CM
2 of 7 series · 14 of 46 positions shown, 16 images · IV contrast (APPLIED)
Comparison: None Available.

CLINICAL DATA: Acute aortic syndrome suspected.

EXAM:
CT ANGIOGRAPHY CHEST, ABDOMEN AND PELVIS
TECHNIQUE: Non-contrast CT of the chest was initially obtained.

[Series 6: arterial · axial · arterial · 0.86mm/px · z∈[-1145,-537]mm · 11 of 344 slices shown, 13 images]
[im 20/344  soft-tissue]
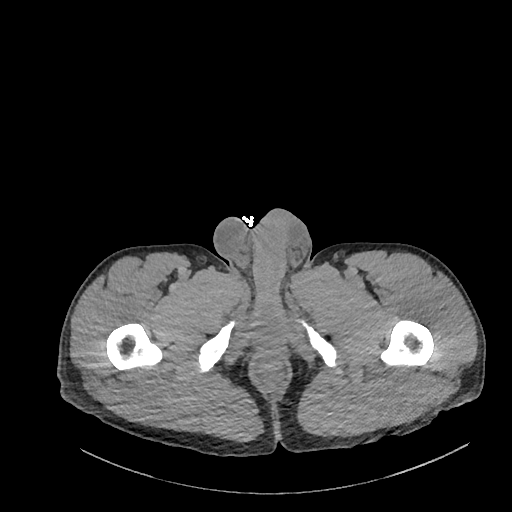
[im 20/344  bone]
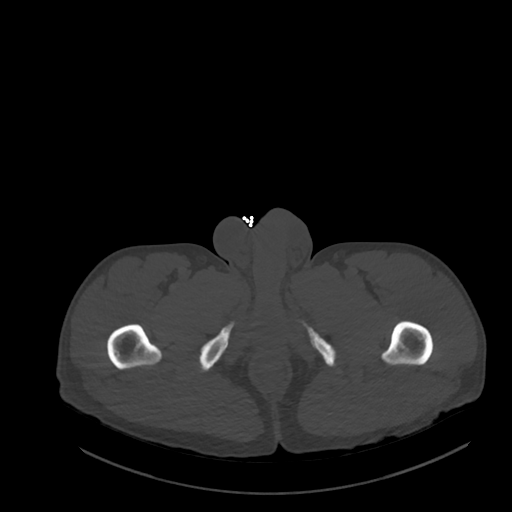
[im 58/344  soft-tissue]
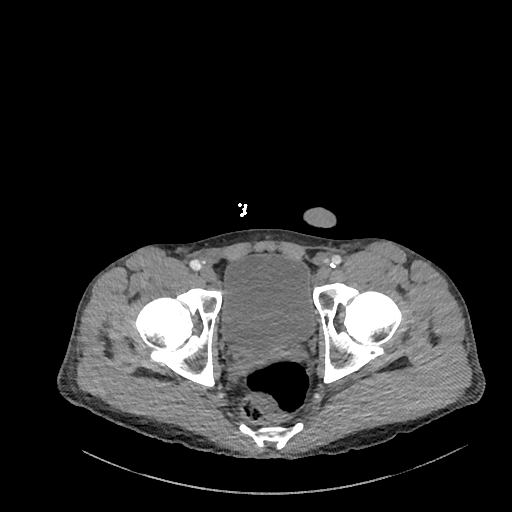
[im 77/344  soft-tissue]
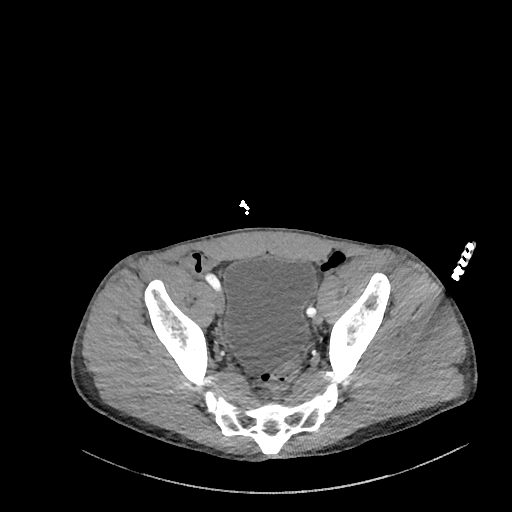
[im 115/344  soft-tissue]
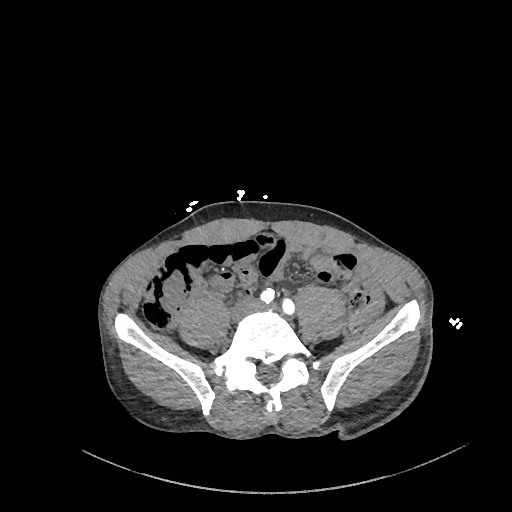
[im 134/344  soft-tissue]
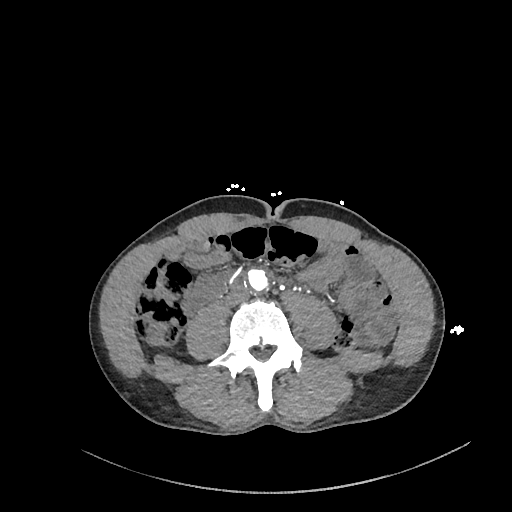
[im 172/344  soft-tissue]
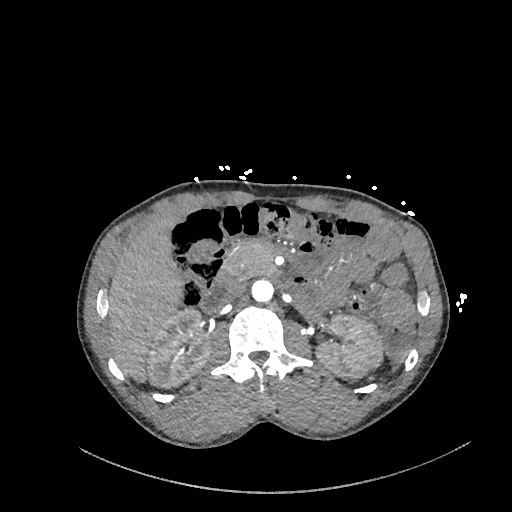
[im 210/344  soft-tissue]
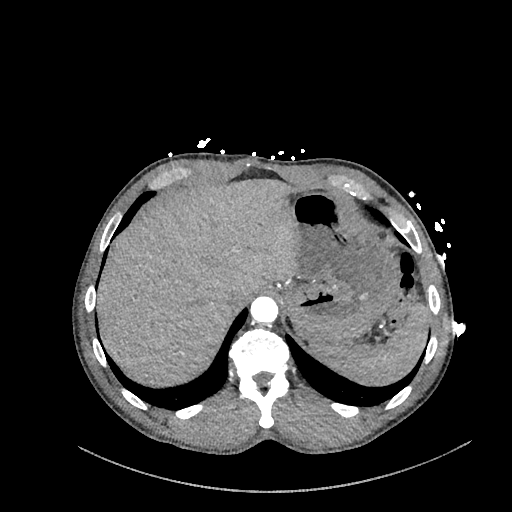
[im 229/344  soft-tissue]
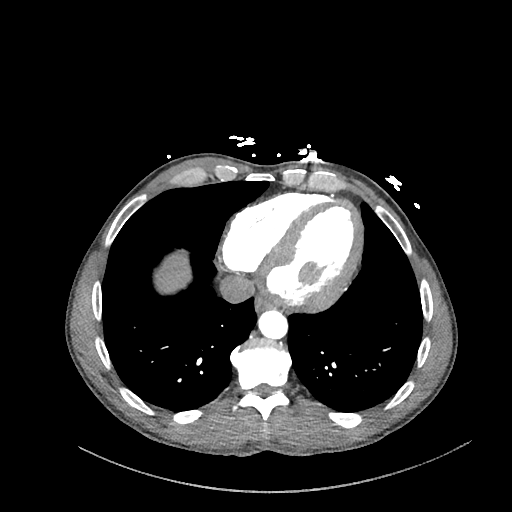
[im 267/344  soft-tissue]
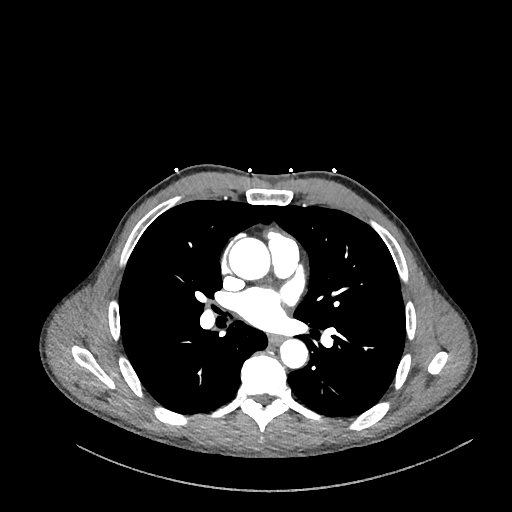
[im 267/344  bone]
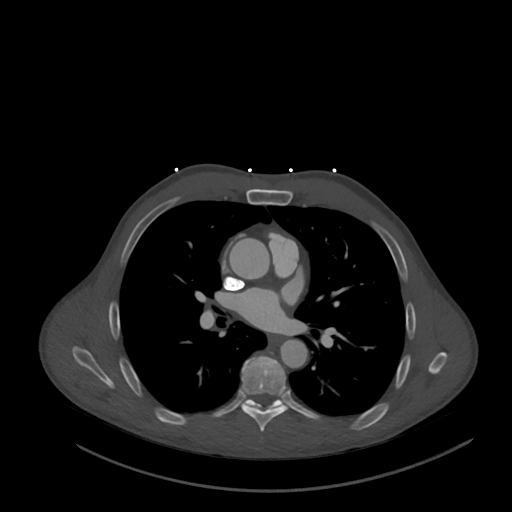
[im 286/344  soft-tissue]
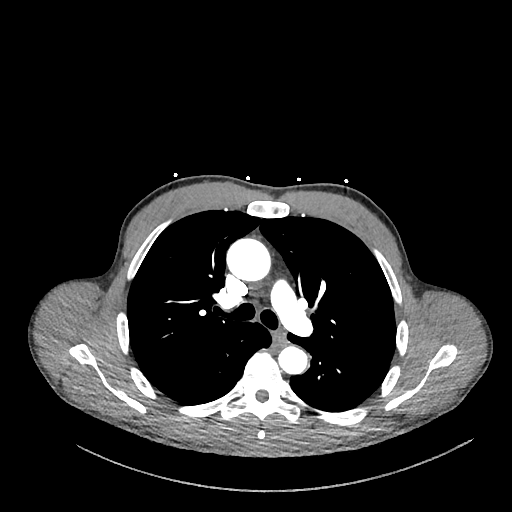
[im 324/344  soft-tissue]
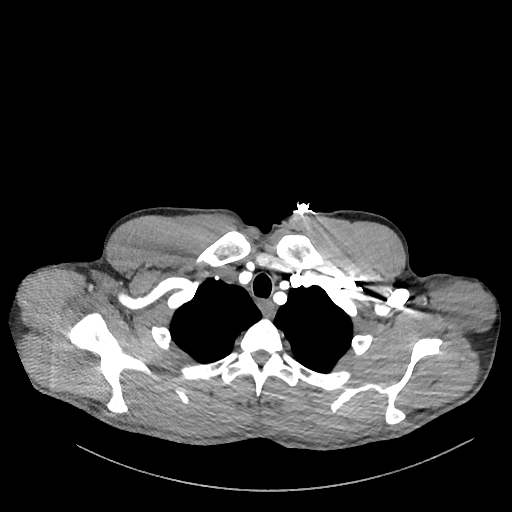

[Series 9: cor · coronal · 1.01mm/px · 3 of 160 slices shown]
[im 40/160  soft-tissue]
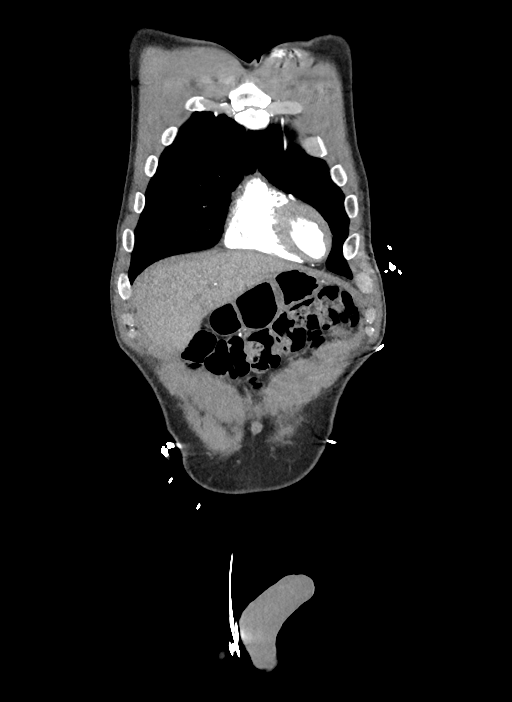
[im 80/160  soft-tissue]
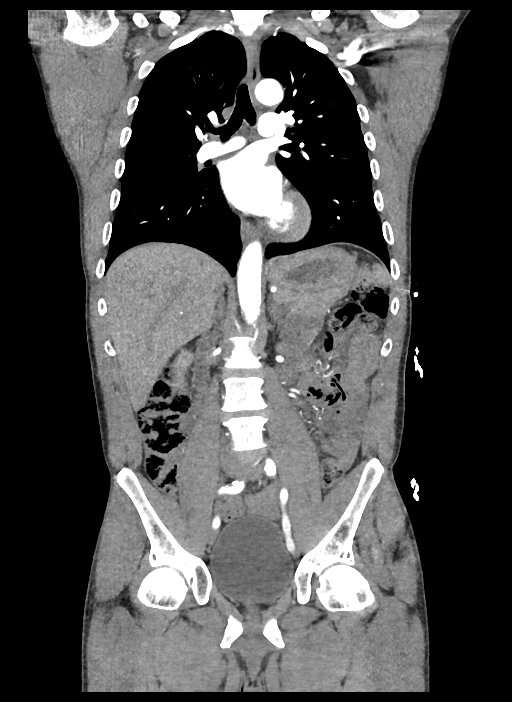
[im 120/160  soft-tissue]
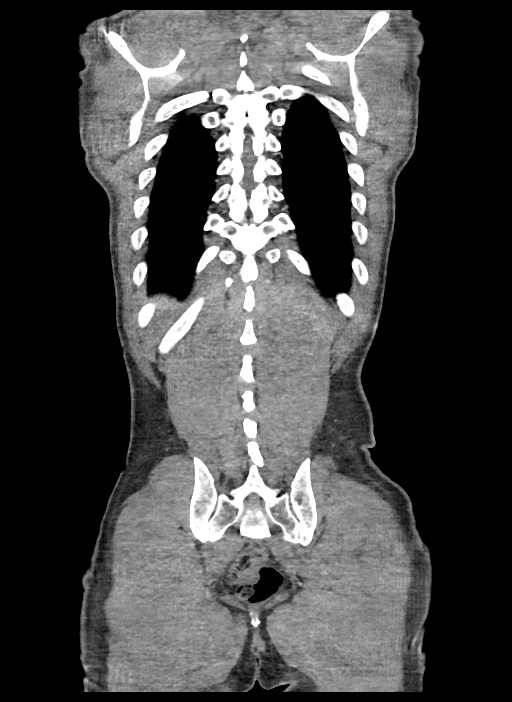

[14 of 46 positions shown; findings below may reference images not displayed]

Multidetector CT imaging through the chest, abdomen and pelvis was
performed using the standard protocol during bolus administration of
intravenous contrast. Multiplanar reconstructed images and MIPs were
obtained and reviewed to evaluate the vascular anatomy.

RADIATION DOSE REDUCTION: This exam was performed according to the
departmental dose-optimization program which includes automated
exposure control, adjustment of the mA and/or kV according to
patient size and/or use of iterative reconstruction technique.

CONTRAST:  100mL OMNIPAQUE IOHEXOL 350 MG/ML SOLN
FINDINGS: CTA CHEST FINDINGS

Cardiovascular: There is no cardiomegaly or pericardial effusion.
Mild atherosclerotic calcification of the thoracic aorta. No
aneurysmal dilatation or dissection. The origins of the great
vessels of the aortic arch appear patent as visualized. No pulmonary
artery embolus identified.

Mediastinum/Nodes: No hilar or mediastinal adenopathy. The esophagus
and the thyroid gland are grossly unremarkable. No mediastinal fluid
collection.

Lungs/Pleura: The lungs are clear. There is no pleural effusion or
pneumothorax. The central airways are patent.

Musculoskeletal: No acute osseous pathology.

Review of the MIP images confirms the above findings.

CTA ABDOMEN AND PELVIS FINDINGS

VASCULAR

Aorta: Moderate calcified and noncalcified plaque. There is a
partially thrombosed 2.7 cm distal abdominal aortic ectasia with a 7
mm penetrating ulcer (216/6). No aortic dissection. No periaortic
fluid collection.

Celiac: Patent without evidence of aneurysm, dissection, vasculitis
or significant stenosis.

SMA: Patent without evidence of aneurysm, dissection, vasculitis or
significant stenosis.

Renals: Both renal arteries are patent without evidence of aneurysm,
dissection, vasculitis, fibromuscular dysplasia or significant
stenosis.

IMA: The IMA is not identified with certainty.

Inflow: Moderate atherosclerotic calcification. No aneurysmal
dilatation. The [address]ck arteries remain patent.

Veins: No obvious venous abnormality within the limitations of this
arterial phase study.

Review of the MIP images confirms the above findings.

NON-VASCULAR

No intra-abdominal free air or free fluid.

Hepatobiliary: No focal liver abnormality is seen. No gallstones,
gallbladder wall thickening, or biliary dilatation.

Pancreas: Unremarkable. No pancreatic ductal dilatation or
surrounding inflammatory changes.

Spleen: Normal in size without focal abnormality.

Adrenals/Urinary Tract: Adrenal glands are unremarkable. Kidneys are
normal, without renal calculi, focal lesion, or hydronephrosis.
Bladder is unremarkable.

Stomach/Bowel: Moderate stool throughout the colon. No bowel
obstruction or active inflammation. The appendix is not visualized
with certainty. No inflammatory changes identified in the right
lower quadrant.

Lymphatic: No adenopathy.

Reproductive: The prostate and seminal vesicles are grossly
unremarkable. Small bilateral hydroceles.

Other: None

Musculoskeletal: Degenerative changes of the spine. No acute osseous
pathology.

Review of the MIP images confirms the above findings.
IMPRESSION: 1. No acute intrathoracic, abdominal, or pelvic pathology. No aortic
dissection.
2. A 2.7 cm partially thrombosed distal abdominal aortic ectasia
with a 7 mm penetrating ulcer. Recommend follow-up ultrasound every
5 years. This recommendation follows ACR consensus guidelines: White
Paper of the ACR Incidental Findings Committee II on Vascular
Findings. [HOSPITAL] 5044; [DATE].
